# Patient Record
Sex: Female | Born: 1990 | Race: Black or African American | Hispanic: No | Marital: Single | Smoking: Never smoker
Health system: Southern US, Community
[De-identification: ages and names within clinical notes are randomized; demographics above are authoritative.]

## PROBLEM LIST (undated history)

## (undated) DIAGNOSIS — D649 Anemia, unspecified: Secondary | ICD-10-CM

## (undated) HISTORY — DX: Anemia, unspecified: D64.9

## (undated) HISTORY — PX: NO PAST SURGERIES: SHX2092

---

## 2019-08-20 ENCOUNTER — Other Ambulatory Visit: Payer: Self-pay

## 2019-08-20 ENCOUNTER — Encounter (HOSPITAL_COMMUNITY): Payer: Self-pay | Admitting: Emergency Medicine

## 2019-08-20 ENCOUNTER — Emergency Department (HOSPITAL_COMMUNITY)
Admission: EM | Admit: 2019-08-20 | Discharge: 2019-08-20 | Disposition: A | Discharge status: Home / Self Care | Payer: Medicaid Other | Attending: Emergency Medicine | Admitting: Emergency Medicine

## 2019-08-20 DIAGNOSIS — R21 Rash and other nonspecific skin eruption: Secondary | ICD-10-CM | POA: Diagnosis present

## 2019-08-20 DIAGNOSIS — T63461A Toxic effect of venom of wasps, accidental (unintentional), initial encounter: Secondary | ICD-10-CM | POA: Diagnosis not present

## 2019-08-20 DIAGNOSIS — M6282 Rhabdomyolysis: Secondary | ICD-10-CM | POA: Insufficient documentation

## 2019-08-20 DIAGNOSIS — T63481A Toxic effect of venom of other arthropod, accidental (unintentional), initial encounter: Secondary | ICD-10-CM

## 2019-08-20 LAB — CBC WITH DIFFERENTIAL/PLATELET
Abs Immature Granulocytes: 0.03 10*3/uL (ref 0.00–0.07)
Basophils Absolute: 0 10*3/uL (ref 0.0–0.1)
Basophils Relative: 0 %
Eosinophils Absolute: 0 10*3/uL (ref 0.0–0.5)
Eosinophils Relative: 0 %
HCT: 39.4 % (ref 36.0–46.0)
Hemoglobin: 12.9 g/dL (ref 12.0–15.0)
Immature Granulocytes: 0 %
Lymphocytes Relative: 12 %
Lymphs Abs: 1.2 10*3/uL (ref 0.7–4.0)
MCH: 29.1 pg (ref 26.0–34.0)
MCHC: 32.7 g/dL (ref 30.0–36.0)
MCV: 88.9 fL (ref 80.0–100.0)
Monocytes Absolute: 0.5 10*3/uL (ref 0.1–1.0)
Monocytes Relative: 5 %
Neutro Abs: 8.2 10*3/uL — ABNORMAL HIGH (ref 1.7–7.7)
Neutrophils Relative %: 83 %
Platelets: 502 10*3/uL — ABNORMAL HIGH (ref 150–400)
RBC: 4.43 MIL/uL (ref 3.87–5.11)
RDW: 13.2 % (ref 11.5–15.5)
WBC: 10 10*3/uL (ref 4.0–10.5)
nRBC: 0 % (ref 0.0–0.2)

## 2019-08-20 LAB — COMPREHENSIVE METABOLIC PANEL
ALT: 15 U/L (ref 0–44)
AST: 26 U/L (ref 15–41)
Albumin: 4.2 g/dL (ref 3.5–5.0)
Alkaline Phosphatase: 51 U/L (ref 38–126)
Anion gap: 11 (ref 5–15)
BUN: 10 mg/dL (ref 6–20)
CO2: 26 mmol/L (ref 22–32)
Calcium: 9.2 mg/dL (ref 8.9–10.3)
Chloride: 99 mmol/L (ref 98–111)
Creatinine, Ser: 0.76 mg/dL (ref 0.44–1.00)
GFR calc Af Amer: >60 mL/min (ref >60)
GFR calc non Af Amer: >60 mL/min (ref >60)
Glucose, Bld: 100 mg/dL — ABNORMAL HIGH (ref 70–99)
Potassium: 4.2 mmol/L (ref 3.5–5.1)
Sodium: 136 mmol/L (ref 135–145)
Total Bilirubin: 0.5 mg/dL (ref 0.3–1.2)
Total Protein: 8.4 g/dL — ABNORMAL HIGH (ref 6.5–8.1)

## 2019-08-20 LAB — URINALYSIS, ROUTINE W REFLEX MICROSCOPIC
Bacteria, UA: NONE SEEN
Bilirubin Urine: NEGATIVE
Glucose, UA: NEGATIVE mg/dL
Ketones, ur: 5 mg/dL — AB
Leukocytes,Ua: NEGATIVE
Nitrite: NEGATIVE
Protein, ur: NEGATIVE mg/dL
Specific Gravity, Urine: 1.029 (ref 1.005–1.030)
pH: 6 (ref 5.0–8.0)

## 2019-08-20 LAB — CK: Total CK: 763 U/L — ABNORMAL HIGH (ref 38–234)

## 2019-08-20 LAB — I-STAT BETA HCG BLOOD, ED (MC, WL, AP ONLY): I-stat hCG, quantitative: <5 m[IU]/mL (ref <5)

## 2019-08-20 LAB — LIPASE, BLOOD: Lipase: 19 U/L (ref 11–51)

## 2019-08-20 MED ORDER — DIPHENHYDRAMINE HCL 50 MG/ML IJ SOLN
25.0000 mg | Freq: Once | INTRAMUSCULAR | Status: AC
  Administered 2019-08-20: 25 mg via INTRAVENOUS
  Filled 2019-08-20: qty 1

## 2019-08-20 MED ORDER — SODIUM CHLORIDE 0.9 % IV BOLUS
1000.0000 mL | Freq: Once | INTRAVENOUS | Status: AC
  Administered 2019-08-20: 1000 mL via INTRAVENOUS

## 2019-08-20 MED ORDER — EPINEPHRINE 0.3 MG/0.3ML IJ SOAJ: 0.3000 mg | INTRAMUSCULAR | 0 refills | Status: AC | PRN

## 2019-08-20 NOTE — ED Provider Notes (Signed)
Joann Jordan   CSN: [id number] Arrival date & time: 08/20/19  0249     History Chief Complaint  Patient presents with  . Rash    Joann Jordan is a 29 y.o. female.  Joann Jordan is a 29 y.o. female who is otherwise healthy, presents to the ED for evaluation of rash.  She reports today around noon she was at work when she got stung on the back of her neck by a yellow jacket that had gotten into the building.  She states that aside from some mild pain surrounding the site of the sting she felt fine, she did take a Benadryl at work, does not have any known allergy to bees, wasps or yellow jackets.  She states when she got home from work around 5 she noticed a rash that consisted of small red bumps to her arms, legs and she also noted some on her back.  The rash was somewhat itchy, not painful.  She did state that it burns some when she tried to take a shower.  Around 9 PM she started to feel generally ill and malaise with body aches, nausea, some upper abdominal pain and back pain.  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  She denies any fevers.  No known sick contacts.  No chest pain or shortness of breath.  No cough.  No abnormal bowel movements.  No dysuria or urinary frequency.  No other aggravating or alleviating factors.        History reviewed. No pertinent past medical history.  There are no problems to display for this patient.   History reviewed. No pertinent surgical history.   OB History   No obstetric history on file.     History reviewed. No pertinent family history.  Social History   Tobacco Use  . Smoking status: Never Smoker  . Smokeless tobacco: Never Used  Vaping Use  . Vaping Use: Never used  Substance Use Topics  . Alcohol use: Never  . Drug use: Never    Home  Medications Prior to Admission medications   Medication Sig Start Date End Date Taking? Authorizing Provider  EPINEPHrine (EPIPEN 2-PAK) 0.3 mg/0.3 mL IJ SOAJ injection Inject 0.3 mLs (0.3 mg total) into the muscle as needed for anaphylaxis. 08/20/19   Dartha Lodge, PA-C    Allergies    Patient has no known allergies.  Review of Systems   Review of Systems  Constitutional: Positive for fatigue. Negative for chills and fever.  HENT: Negative for facial swelling, sore throat and trouble swallowing.   Eyes: Negative for visual disturbance.  Respiratory: Negative for cough and shortness of breath.   Cardiovascular: Negative for chest pain.  Gastrointestinal: Positive for abdominal pain and nausea. Negative for constipation, diarrhea and vomiting.  Genitourinary: Negative for dysuria, frequency, vaginal bleeding and vaginal discharge.  Musculoskeletal: Positive for back pain and myalgias. Negative for arthralgias.  Skin: Positive for rash. Negative for color change.  Neurological: Negative for dizziness, weakness, numbness and headaches.  All other systems reviewed and are negative.   Physical Exam Updated Vital Signs BP 135/65 (BP Location: Right Arm)   Pulse 97   Temp 99.1 F (37.3 C) (Oral)   Resp 15   Ht 5\' 2"  (1.575 m)   Wt 81.6 kg   SpO2 100%  BMI 32.92 kg/m   Physical Exam Vitals and nursing Jordan reviewed.  Constitutional:      General: She is not in acute distress.    Appearance: Normal appearance. She is well-developed. She is not ill-appearing or diaphoretic.  HENT:     Head: Normocephalic and atraumatic.     Mouth/Throat:     Mouth: Mucous membranes are moist.     Pharynx: Oropharynx is clear.     Comments: No Intraoral rash or lesions Eyes:     General:        Right eye: No discharge.        Left eye: No discharge.     Pupils: Pupils are equal, round, and reactive to light.  Neck:     Comments: 2 cm erythematous welts noted on right posterior neck where  patient reports that she was stung by a yellow jacket Cardiovascular:     Rate and Rhythm: Normal rate and regular rhythm.     Heart sounds: Normal heart sounds. No murmur heard.  No friction rub. No gallop.   Pulmonary:     Effort: Pulmonary effort is normal. No respiratory distress.     Breath sounds: Normal breath sounds. No wheezing or rales.     Comments: Respirations equal and unlabored, patient able to speak in full sentences, lungs clear to auscultation bilaterally Abdominal:     General: Bowel sounds are normal. There is no distension.     Palpations: Abdomen is soft. There is no mass.     Tenderness: There is abdominal tenderness. There is no guarding.     Comments: Abdomen is soft, nondistended, bowel sounds present throughout, there is some mild epigastric tenderness, all other quadrants nontender, no CVA tenderness  Musculoskeletal:        General: No deformity.     Cervical back: Neck supple.     Comments: T-spine and L-spine nontender to palpation at midline. Mild tenderness across low back musclualture Patient moves all extremities without difficulty. All joints supple and easily movable, no erythema, swelling or palpable deformity, all compartments soft.   Skin:    General: Skin is warm and dry.     Capillary Refill: Capillary refill takes less than 2 seconds.     Findings: Rash present.     Comments: Scattered small raised erythematous bumps already upper and lower extremities as well as a few noted on the back.  These do blanch I did not see any vesicles, pustules or petechiae, no skin sloughing.  Neurological:     Mental Status: She is alert.     Coordination: Coordination normal.     Comments: Speech is clear, able to follow commands Moves extremities without ataxia, coordination intact  Psychiatric:        Mood and Affect: Mood normal.        Behavior: Behavior normal.     ED Results / Procedures / Treatments   Labs (all labs ordered are listed, but only  abnormal results are displayed) Labs Reviewed  URINALYSIS, ROUTINE W REFLEX MICROSCOPIC - Abnormal; Notable for the following components:      Result Value   Hgb urine dipstick MODERATE (*)    Ketones, ur 5 (*)    All other components within normal limits  COMPREHENSIVE METABOLIC PANEL - Abnormal; Notable for the following components:   Glucose, Bld 100 (*)    Total Protein 8.4 (*)    All other components within normal limits  CBC WITH DIFFERENTIAL/PLATELET - Abnormal; Notable for  the following components:   Platelets 502 (*)    Neutro Abs 8.2 (*)    All other components within normal limits  CK - Abnormal; Notable for the following components:   Total CK 763 (*)    All other components within normal limits  LIPASE, BLOOD  I-STAT BETA HCG BLOOD, ED ([facility], [facility], [facility] ONLY)    EKG None  Radiology No results found.  Procedures Procedures (including critical care time)  Medications Ordered in ED Medications  diphenhydrAMINE (BENADRYL) injection 25 mg (25 mg Intravenous Given 08/20/19 0607)  sodium chloride 0.9 % bolus 1,000 mL (1,000 mLs Intravenous New Bag/Given 08/20/19 5035)    ED Course  I have reviewed the triage vital signs and the nursing notes.  Pertinent labs & imaging results that were available during my care of the patient were reviewed by me and considered in my medical decision making (see chart for details).    MDM Rules/Calculators/A&P                         29 year old female presents with rash and generalized fatigue, body aches and some upper abdominal pain after she was stung by a yellow jacket earlier this afternoon.  Did not have immediate allergic reaction, no symptoms suggestive of anaphylaxis, but when she got home she noted a rash on her extremities and later in the evening he began to feel malaise and fatigue with body aches, some nausea and upper abdominal pain.  On arrival patient has normal vitals normal is well-appearing and in no distress.  There is a  welt on her neck where she states she was stung by yellow jacket, and she has a rabies erythematous rash over the lower extremities that does not appear consistent with urticaria.  Given her general malaise and fatigue with no other signs suggestive of a more severe allergic reaction will check basic labs and urinalysis.  Will give IV fluids and Benadryl.  I have personally ordered, reviewed and interpreted lab work: CBC: No leukocytosis, normal hemoglobin, platelets of 502 CMP: No significant electrolyte derangements, normal renal and liver function Lipase WNL UA: Moderate hemoglobin noted but with no red cells, I am more concerned this may be myoglobin given fatigue malaise and myalgias.  Patient with no signs of UTI, will check CK  Patient CK is mildly elevated at 763, this is not high enough to warrant admission but is likely early or mild rhabdomyolysis.  Patient given IV fluids.  This could be related to insect sting or from strenuous activities at that point.  After fluid she is feeling improved.  I have counseled her on resting over the next few days and avoiding strenuous activity, drinking plenty of fluids and I discussed strict return precautions with the patient.  Benadryl for rash.  Patient expresses understanding and agreement.  Discharged home in good condition. Final Clinical Impression(s) / ED Diagnoses Final diagnoses:  Non-traumatic rhabdomyolysis  Rash  Insect stings, accidental or unintentional, initial encounter    Rx / DC Orders ED Discharge Orders         Ordered    EPINEPHrine (EPIPEN 2-PAK) 0.3 mg/0.3 mL IJ SOAJ injection  As needed     Discontinue  Reprint     08/20/19 0656           Dartha Lodge, PA-C 08/20/19 2356    Nira Conn, MD 08/21/19 [phone]

## 2019-08-20 NOTE — ED Triage Notes (Signed)
Patient states that she got stung by a yellow jacket yesterday. Patient states that she did not have any difficulty breathing or any swelling. Patient states when she got home from work she saw red bumps on both legs. Patient also has the same rash on her lower back. Patient states that she started having abdominal pain around 12 am. Patient is having some white discharge. No burning when urinating.

## 2019-08-20 NOTE — Discharge Instructions (Signed)
I suspect the you have been feeling poorly with body and muscle aches because of muscle breakdown, called rhabdomyolysis, this appears to be mild and is not affecting her kidney function.  It is important that you avoid any strenuous activity and make sure you are drinking plenty of fluids.  If you start to feel worse with worsening body aches, fatigue, or if you notice very dark urine you should return to the emergency department to have your CK levels and kidney function rechecked.  Sometimes this can occur because of an insect sting, and with your rash I suspect you have had some mild allergic reaction, if you were to get stung again you could have a more severe allergic reaction so you should carry an EpiPen with you, this has been prescribed today.

## 2019-08-22 ENCOUNTER — Encounter (HOSPITAL_COMMUNITY): Payer: Self-pay

## 2019-08-22 ENCOUNTER — Emergency Department (HOSPITAL_COMMUNITY)
Admission: EM | Admit: 2019-08-22 | Discharge: 2019-08-22 | Disposition: A | Discharge status: Home / Self Care | Payer: Medicaid Other | Attending: Emergency Medicine | Admitting: Emergency Medicine

## 2019-08-22 ENCOUNTER — Other Ambulatory Visit: Payer: Self-pay

## 2019-08-22 DIAGNOSIS — R109 Unspecified abdominal pain: Secondary | ICD-10-CM | POA: Insufficient documentation

## 2019-08-22 DIAGNOSIS — Z5321 Procedure and treatment not carried out due to patient leaving prior to being seen by health care provider: Secondary | ICD-10-CM | POA: Insufficient documentation

## 2019-08-22 NOTE — ED Triage Notes (Signed)
She states she was seen here a couple of days ago for diffuse upper abd. Discomfort and generalized small urticaria. She states her symptoms persist. She is breathing normally.

## 2019-11-02 LAB — PREGNANCY, URINE: Preg Test, Ur: POSITIVE

## 2019-11-23 ENCOUNTER — Other Ambulatory Visit (HOSPITAL_COMMUNITY)
Admission: RE | Admit: 2019-11-23 | Discharge: 2019-11-23 | Disposition: A | Discharge status: Home / Self Care | Payer: Medicaid Other | Source: Ambulatory Visit | Attending: Obstetrics & Gynecology | Admitting: Obstetrics & Gynecology

## 2019-11-23 ENCOUNTER — Other Ambulatory Visit: Payer: Self-pay

## 2019-11-23 ENCOUNTER — Ambulatory Visit (INDEPENDENT_AMBULATORY_CARE_PROVIDER_SITE_OTHER): Payer: Medicaid Other | Admitting: Obstetrics & Gynecology

## 2019-11-23 ENCOUNTER — Encounter: Payer: Self-pay | Admitting: Obstetrics & Gynecology

## 2019-11-23 ENCOUNTER — Encounter: Payer: Self-pay | Admitting: *Deleted

## 2019-11-23 VITALS — BP 114/68 | HR 82 | Wt 175.9 lb

## 2019-11-23 DIAGNOSIS — K219 Gastro-esophageal reflux disease without esophagitis: Secondary | ICD-10-CM

## 2019-11-23 DIAGNOSIS — Z8632 Personal history of gestational diabetes: Secondary | ICD-10-CM

## 2019-11-23 DIAGNOSIS — O0990 Supervision of high risk pregnancy, unspecified, unspecified trimester: Secondary | ICD-10-CM

## 2019-11-23 DIAGNOSIS — O99619 Diseases of the digestive system complicating pregnancy, unspecified trimester: Secondary | ICD-10-CM

## 2019-11-23 LAB — OB RESULTS CONSOLE GBS: GBS: POSITIVE

## 2019-11-23 MED ORDER — BLOOD PRESSURE KIT DEVI: 1.0000 | Freq: Every day | 0 refills | Status: AC

## 2019-11-23 MED ORDER — PANTOPRAZOLE SODIUM 20 MG PO TBEC: 20.0000 mg | DELAYED_RELEASE_TABLET | Freq: Every day | ORAL | 5 refills | Status: DC

## 2019-11-23 NOTE — Progress Notes (Signed)
Pap was done at [facility] in [place] in 2020 will need to sign release of information for this.  Diet controlled GDM in last Pregnancy   Considering BTL   Sent baby scripts information

## 2019-11-23 NOTE — Patient Instructions (Signed)

## 2019-11-23 NOTE — Progress Notes (Signed)
  Subjective:Delivered at [facility] last pregnancy and had GDM    Joann Jordan is a [id number] [redacted]w[redacted]d being seen today for her first obstetrical visit.  Her obstetrical history is significant for h/o GDM. Patient does intend to breast feed. Pregnancy history fully reviewed.  Patient reports heartburn and vomiting.  Vitals:   11/23/19 1050  BP: 114/68  Pulse: 82  Weight: 175 lb 14.4 oz (79.8 kg)    HISTORY: OB History  Gravida Para Term Preterm AB Living  3 2 2     2   SAB TAB Ectopic Multiple Live Births          2    # Outcome Date GA Lbr Len/2nd Weight Sex Delivery Anes PTL Lv  3 Current           2 Term 01/21/18    M  EPI  LIV  1 Term 11/04/14    M Vag-Spont EPI  LIV   History reviewed. No pertinent past medical history. Past Surgical History:  Procedure Laterality Date  . NO PAST SURGERIES     Family History  Problem Relation Age of Onset  . Diabetes Mother   . Diabetes Father      Exam    Uterus:     Pelvic Exam:    Perineum: No Hemorrhoids   Vulva: normal   Vagina:  normal mucosa   pH:     Cervix: closed   Adnexa: normal adnexa   Bony Pelvis: average  System: Breast:  normal appearance, no masses or tenderness   Skin: normal coloration and turgor, no rashes    Neurologic: oriented, normal mood   Extremities: normal strength, tone, and muscle mass   HEENT PERRLA, neck supple with midline trachea and thyroid without masses   Mouth/Teeth mucous membranes moist, pharynx normal without lesions   Neck supple and no masses   Cardiovascular: regular rate and rhythm, no murmurs or gallops   Respiratory:  appears well, vitals normal, no respiratory distress, acyanotic, normal RR, neck free of mass or lymphadenopathy, chest clear, no wheezing, crepitations, rhonchi, normal symmetric air entry   Abdomen: soft, non-tender; bowel sounds normal; no masses,  no organomegaly   Urinary: urethral meatus normal      Assessment:    Pregnancy: 11/06/14 Patient Active Problem  List   Diagnosis Date Noted  . Supervision of high risk pregnancy, antepartum 11/23/2019        Plan:     Initial labs drawn. Prenatal vitamins. Problem list reviewed and updated. Genetic Screening discussed  ordered.  Ultrasound discussed; fetal survey: ordered.  Follow up in 4 weeks. 50% of 30 min visit spent on counseling and coordination of care.  Protonix for reflux   11/25/2019 11/23/2019

## 2019-11-24 LAB — CBC/D/PLT+RPR+RH+ABO+RUB AB...
Antibody Screen: NEGATIVE
Basophils Absolute: 0 10*3/uL (ref 0.0–0.2)
Basos: 0 %
EOS (ABSOLUTE): 0.1 10*3/uL (ref 0.0–0.4)
Eos: 1 %
HCV Ab: 0.2 s/co ratio (ref 0.0–0.9)
HIV Screen 4th Generation wRfx: NONREACTIVE
Hematocrit: 34.6 % (ref 34.0–46.6)
Hemoglobin: 11.6 g/dL (ref 11.1–15.9)
Hepatitis B Surface Ag: NEGATIVE
Immature Grans (Abs): 0 10*3/uL (ref 0.0–0.1)
Immature Granulocytes: 0 %
Lymphocytes Absolute: 1.7 10*3/uL (ref 0.7–3.1)
Lymphs: 25 %
MCH: 29.5 pg (ref 26.6–33.0)
MCHC: 33.5 g/dL (ref 31.5–35.7)
MCV: 88 fL (ref 79–97)
Monocytes Absolute: 0.4 10*3/uL (ref 0.1–0.9)
Monocytes: 6 %
Neutrophils Absolute: 4.5 10*3/uL (ref 1.4–7.0)
Neutrophils: 68 %
Platelets: 343 10*3/uL (ref 150–450)
RBC: 3.93 x10E6/uL (ref 3.77–5.28)
RDW: 13.2 % (ref 11.7–15.4)
RPR Ser Ql: NONREACTIVE
Rh Factor: POSITIVE
Rubella Antibodies, IGG: 3.73 index (ref >0.99)
WBC: 6.8 10*3/uL (ref 3.4–10.8)

## 2019-11-24 LAB — CERVICOVAGINAL ANCILLARY ONLY
Bacterial Vaginitis (gardnerella): POSITIVE — AB
Candida Glabrata: NEGATIVE
Candida Vaginitis: NEGATIVE
Chlamydia: POSITIVE — AB
Comment: NEGATIVE
Comment: NEGATIVE
Comment: NEGATIVE
Comment: NEGATIVE
Comment: NEGATIVE
Comment: NORMAL
Neisseria Gonorrhea: NEGATIVE
Trichomonas: NEGATIVE

## 2019-11-24 LAB — HCV INTERPRETATION

## 2019-11-25 ENCOUNTER — Encounter: Payer: Self-pay | Admitting: *Deleted

## 2019-11-25 ENCOUNTER — Other Ambulatory Visit (HOSPITAL_COMMUNITY): Payer: Self-pay | Admitting: Obstetrics & Gynecology

## 2019-11-25 ENCOUNTER — Other Ambulatory Visit: Payer: Self-pay | Admitting: *Deleted

## 2019-11-25 DIAGNOSIS — O98819 Other maternal infectious and parasitic diseases complicating pregnancy, unspecified trimester: Secondary | ICD-10-CM

## 2019-11-25 DIAGNOSIS — B9689 Other specified bacterial agents as the cause of diseases classified elsewhere: Secondary | ICD-10-CM

## 2019-11-25 DIAGNOSIS — N760 Acute vaginitis: Secondary | ICD-10-CM

## 2019-11-25 DIAGNOSIS — A749 Chlamydial infection, unspecified: Secondary | ICD-10-CM

## 2019-11-25 DIAGNOSIS — O219 Vomiting of pregnancy, unspecified: Secondary | ICD-10-CM

## 2019-11-25 MED ORDER — PROMETHAZINE HCL 25 MG PO TABS
25.0000 mg | ORAL_TABLET | Freq: Four times a day (QID) | ORAL | 0 refills | Status: DC | PRN
Start: 2019-11-25 — End: 2020-02-18

## 2019-11-25 MED ORDER — METRONIDAZOLE 500 MG PO TABS: 500.0000 mg | ORAL_TABLET | Freq: Two times a day (BID) | ORAL | 0 refills | Status: DC

## 2019-11-25 MED ORDER — AZITHROMYCIN 250 MG PO TABS
ORAL_TABLET | ORAL | 0 refills | Status: DC
Start: 2019-11-25 — End: 2019-12-22

## 2019-11-25 MED ORDER — METRONIDAZOLE 500 MG PO TABS: 500.0000 mg | ORAL_TABLET | Freq: Two times a day (BID) | ORAL | 0 refills | Status: AC

## 2019-11-25 NOTE — Addendum Note (Signed)
Addended by: Jill Side on: 11/25/2019 03:04 PM   Modules accepted: Orders

## 2019-11-26 ENCOUNTER — Encounter: Payer: Self-pay | Admitting: General Practice

## 2019-11-27 LAB — URINE CULTURE, OB REFLEX

## 2019-11-27 LAB — CULTURE, OB URINE

## 2019-11-30 ENCOUNTER — Other Ambulatory Visit (HOSPITAL_COMMUNITY): Payer: Self-pay | Admitting: Obstetrics & Gynecology

## 2019-11-30 DIAGNOSIS — O99820 Streptococcus B carrier state complicating pregnancy: Secondary | ICD-10-CM

## 2019-11-30 DIAGNOSIS — O0990 Supervision of high risk pregnancy, unspecified, unspecified trimester: Secondary | ICD-10-CM

## 2019-11-30 MED ORDER — AMPICILLIN 500 MG PO CAPS: 500.0000 mg | ORAL_CAPSULE | Freq: Four times a day (QID) | ORAL | 0 refills | Status: DC

## 2019-12-01 ENCOUNTER — Encounter: Payer: Self-pay | Admitting: General Practice

## 2019-12-14 ENCOUNTER — Ambulatory Visit: Payer: Medicaid Other | Attending: Obstetrics & Gynecology

## 2019-12-14 ENCOUNTER — Other Ambulatory Visit: Payer: Self-pay | Admitting: *Deleted

## 2019-12-14 ENCOUNTER — Ambulatory Visit: Payer: Medicaid Other | Admitting: *Deleted

## 2019-12-14 ENCOUNTER — Encounter: Payer: Self-pay | Admitting: *Deleted

## 2019-12-14 ENCOUNTER — Other Ambulatory Visit: Payer: Self-pay

## 2019-12-14 DIAGNOSIS — O0990 Supervision of high risk pregnancy, unspecified, unspecified trimester: Secondary | ICD-10-CM

## 2019-12-14 DIAGNOSIS — Z6832 Body mass index (BMI) 32.0-32.9, adult: Secondary | ICD-10-CM

## 2019-12-14 DIAGNOSIS — Z8632 Personal history of gestational diabetes: Secondary | ICD-10-CM | POA: Insufficient documentation

## 2019-12-22 ENCOUNTER — Other Ambulatory Visit: Payer: Self-pay

## 2019-12-22 ENCOUNTER — Ambulatory Visit (INDEPENDENT_AMBULATORY_CARE_PROVIDER_SITE_OTHER): Payer: Medicaid Other | Admitting: Obstetrics & Gynecology

## 2019-12-22 VITALS — BP 118/75 | HR 89 | Wt 179.0 lb

## 2019-12-22 DIAGNOSIS — O99619 Diseases of the digestive system complicating pregnancy, unspecified trimester: Secondary | ICD-10-CM

## 2019-12-22 DIAGNOSIS — O0990 Supervision of high risk pregnancy, unspecified, unspecified trimester: Secondary | ICD-10-CM

## 2019-12-22 DIAGNOSIS — K219 Gastro-esophageal reflux disease without esophagitis: Secondary | ICD-10-CM

## 2019-12-22 MED ORDER — ONDANSETRON 4 MG PO TBDP: 4.0000 mg | ORAL_TABLET | Freq: Four times a day (QID) | ORAL | 0 refills | Status: DC | PRN

## 2019-12-22 MED ORDER — PANTOPRAZOLE SODIUM 40 MG PO TBEC: 40.0000 mg | DELAYED_RELEASE_TABLET | Freq: Every day | ORAL | 5 refills | Status: AC

## 2019-12-22 NOTE — Progress Notes (Signed)
Home Medicaid Form completed  

## 2019-12-22 NOTE — Patient Instructions (Signed)

## 2019-12-22 NOTE — Progress Notes (Signed)
° °  PRENATAL VISIT NOTE  Subjective:  Joann Jordan is a 29 y.o. G3P2002 at [redacted]w[redacted]d being seen today for ongoing prenatal care.  She is currently monitored for the following issues for this high-risk pregnancy and has Supervision of high risk pregnancy, antepartum and Group B streptococcal carriage complicating pregnancy on their problem list.  Patient reports heartburn, nausea and vomiting.  Contractions: Not present. Vag. Bleeding: None.  Movement: Present. Denies leaking of fluid.   The following portions of the patient's history were reviewed and updated as appropriate: allergies, current medications, past family history, past medical history, past social history, past surgical history and problem list.   Objective:   Vitals:   12/22/19 0921  BP: 118/75  Pulse: 89  Weight: 179 lb (81.2 kg)    Fetal Status: Fetal Heart Rate (bpm): 149   Movement: Present     General:  Alert, oriented and cooperative. Patient is in no acute distress.  Skin: Skin is warm and dry. No rash noted.   Cardiovascular: Normal heart rate noted  Respiratory: Normal respiratory effort, no problems with respiration noted  Abdomen: Soft, gravid, appropriate for gestational age.  Pain/Pressure: Absent     Pelvic: Cervical exam deferred        Extremities: Normal range of motion.  Edema: Trace  Mental Status: Normal mood and affect. Normal behavior. Normal judgment and thought content.   Assessment and Plan:  Pregnancy: G3P2002 at [redacted]w[redacted]d 1. Supervision of high risk pregnancy, antepartum Screening for ONTD - AFP, Serum, Open Spina Bifida  2. Gastroesophageal reflux in pregnancy Control is not adequate - pantoprazole (PROTONIX) 40 MG tablet; Take 1 tablet (40 mg total) by mouth daily.  Dispense: 30 tablet; Refill: 5 - ondansetron (ZOFRAN ODT) 4 MG disintegrating tablet; Take 1 tablet (4 mg total) by mouth every 6 (six) hours as needed for nausea.  Dispense: 20 tablet; Refill: 0  Preterm labor symptoms and  general obstetric precautions including but not limited to vaginal bleeding, contractions, leaking of fluid and fetal movement were reviewed in detail with the patient. Please refer to After Visit Summary for other counseling recommendations.   Return in about 4 weeks (around 01/19/2020).  Future Appointments  Date Time Provider Department Center  01/11/2020  7:30 AM [facility] NURSE [facility]  01/11/2020  7:45 AM [facility]    Scheryl Darter, MD

## 2019-12-23 ENCOUNTER — Encounter: Payer: Self-pay | Admitting: *Deleted

## 2019-12-24 LAB — AFP, SERUM, OPEN SPINA BIFIDA
AFP MoM: 0.89
AFP Value: 43.3 ng/mL
Gest. Age on Collection Date: 18.9 weeks
Maternal Age At EDD: 28.6 yr
OSBR Risk 1 IN: 10000
Test Results:: NEGATIVE
Weight: 179 [lb_av]

## 2020-01-11 ENCOUNTER — Ambulatory Visit: Payer: Medicaid Other

## 2020-01-16 ENCOUNTER — Other Ambulatory Visit: Payer: Self-pay

## 2020-01-16 DIAGNOSIS — K219 Gastro-esophageal reflux disease without esophagitis: Secondary | ICD-10-CM

## 2020-01-18 ENCOUNTER — Other Ambulatory Visit: Payer: Self-pay | Admitting: *Deleted

## 2020-01-18 ENCOUNTER — Encounter: Payer: Self-pay | Admitting: *Deleted

## 2020-01-18 DIAGNOSIS — O99619 Diseases of the digestive system complicating pregnancy, unspecified trimester: Secondary | ICD-10-CM

## 2020-01-18 MED ORDER — ONDANSETRON 4 MG PO TBDP: 4.0000 mg | ORAL_TABLET | Freq: Four times a day (QID) | ORAL | 0 refills | Status: DC | PRN

## 2020-01-22 ENCOUNTER — Encounter: Payer: Medicaid Other | Admitting: Certified Nurse Midwife

## 2020-01-26 ENCOUNTER — Ambulatory Visit: Payer: Medicaid Other | Attending: Obstetrics & Gynecology | Admitting: *Deleted

## 2020-01-26 ENCOUNTER — Other Ambulatory Visit: Payer: Self-pay

## 2020-01-26 ENCOUNTER — Ambulatory Visit (HOSPITAL_BASED_OUTPATIENT_CLINIC_OR_DEPARTMENT_OTHER): Payer: Medicaid Other

## 2020-01-26 ENCOUNTER — Encounter: Payer: Self-pay | Admitting: *Deleted

## 2020-01-26 DIAGNOSIS — Z6832 Body mass index (BMI) 32.0-32.9, adult: Secondary | ICD-10-CM

## 2020-01-26 DIAGNOSIS — Z3A25 25 weeks gestation of pregnancy: Secondary | ICD-10-CM | POA: Diagnosis not present

## 2020-01-26 DIAGNOSIS — Z362 Encounter for other antenatal screening follow-up: Secondary | ICD-10-CM

## 2020-01-26 DIAGNOSIS — Z3A24 24 weeks gestation of pregnancy: Secondary | ICD-10-CM | POA: Diagnosis not present

## 2020-01-26 DIAGNOSIS — O321XX0 Maternal care for breech presentation, not applicable or unspecified: Secondary | ICD-10-CM | POA: Diagnosis not present

## 2020-01-26 DIAGNOSIS — O358XX0 Maternal care for other (suspected) fetal abnormality and damage, not applicable or unspecified: Secondary | ICD-10-CM | POA: Insufficient documentation

## 2020-01-26 DIAGNOSIS — O0990 Supervision of high risk pregnancy, unspecified, unspecified trimester: Secondary | ICD-10-CM

## 2020-02-01 ENCOUNTER — Ambulatory Visit: Payer: Medicaid Other

## 2020-02-03 ENCOUNTER — Encounter: Payer: Medicaid Other | Admitting: Family Medicine

## 2020-02-06 NOTE — L&D Delivery Note (Signed)
OB/GYN Faculty Practice Delivery Note  Rasheida Broden is a 30 y.o. [id number] s/p SVD at [redacted]w[redacted]d. She was admitted for elective IOL.   ROM: 1h 73m with clear fluid GBS Status:  Positive/-- (10/18 0000) Maximum Maternal Temperature: 99.1  Labor Progress: . Initial SVE: Patient received cytotec, foley balloon. She was started on pitocin and was AROM'd. She then progressed to complete.   Delivery Date/Time: 0630 on 4/14 Delivery: Called to room and patient was complete. Delivered baby with one push. Head delivered LOA. Loose nuchal cord present. Shoulder and body delivered in usual fashion. Infant with spontaneous cry, placed on mother's abdomen, dried and stimulated. Cord clamped x 2 after 1-minute delay, and cut by patient. Cord blood drawn. Placenta delivered spontaneously with gentle cord traction. Fundus firm with massage and Pitocin. Labia, perineum, vagina, and cervix inspected inspected with no lacerations.  Baby Weight: pending  Placenta: Sent to L&D  Complications: None Lacerations: none EBL: 10 mL Analgesia: Epidural    Infant:  APGAR (1 MIN):  9 APGAR (5 MINS): 9  APGAR (10 MINS):     Casper Harrison, MD [facility] Fellow, [facility] for [facility], [facility] 05/19/2020, 1:45 AM

## 2020-02-18 ENCOUNTER — Ambulatory Visit (INDEPENDENT_AMBULATORY_CARE_PROVIDER_SITE_OTHER): Payer: Medicaid Other | Admitting: Obstetrics and Gynecology

## 2020-02-18 ENCOUNTER — Other Ambulatory Visit: Payer: Self-pay

## 2020-02-18 VITALS — BP 117/68 | HR 91 | Wt 186.0 lb

## 2020-02-18 DIAGNOSIS — Z23 Encounter for immunization: Secondary | ICD-10-CM | POA: Diagnosis not present

## 2020-02-18 DIAGNOSIS — O0990 Supervision of high risk pregnancy, unspecified, unspecified trimester: Secondary | ICD-10-CM | POA: Diagnosis not present

## 2020-02-18 DIAGNOSIS — Z3A27 27 weeks gestation of pregnancy: Secondary | ICD-10-CM | POA: Insufficient documentation

## 2020-02-18 DIAGNOSIS — O99820 Streptococcus B carrier state complicating pregnancy: Secondary | ICD-10-CM

## 2020-02-18 NOTE — Patient Instructions (Signed)
Oral Glucose Tolerance Test During Pregnancy Why am I having this test? The oral glucose tolerance test (OGTT) is done to check how your body processes blood sugar (glucose). This is one of several tests used to diagnose diabetes that develops during pregnancy (gestational diabetes mellitus). Gestational diabetes is a short-term form of diabetes that some women develop while they are pregnant. It usually occurs during the second trimester of pregnancy and goes away after delivery. Testing, or screening, for gestational diabetes usually occurs at weeks 24-28 of pregnancy. You may have the OGTT test after having a 1-hour glucose screening test if the results from that test indicate that you may have gestational diabetes. This test may also be needed if:  You have a history of gestational diabetes.  There is a history of giving birth to very large babies or of losing pregnancies (having stillbirths).  You have signs and symptoms of diabetes, such as: ? Changes in your eyesight. ? Tingling or numbness in your hands or feet. ? Changes in hunger, thirst, and urination, and these are not explained by your pregnancy. What is being tested? This test measures the amount of glucose in your blood at different times during a period of 3 hours. This shows how well your body can process glucose. What kind of sample is taken? Blood samples are required for this test. They are usually collected by inserting a needle into a blood vessel.   How do I prepare for this test?  For 3 days before your test, eat normally. Have plenty of carbohydrate-rich foods.  Follow instructions from your health care provider about: ? Eating or drinking restrictions on the day of the test. You may be asked not to eat or drink anything other than water (to fast) starting 8-10 hours before the test. ? Changing or stopping your regular medicines. Some medicines may interfere with this test. Tell a health care provider about:  All  medicines you are taking, including vitamins, herbs, eye drops, creams, and over-the-counter medicines.  Any blood disorders you have.  Any surgeries you have had.  Any medical conditions you have. What happens during the test? First, your blood glucose will be measured. This is referred to as your fasting blood glucose because you fasted before the test. Then, you will drink a glucose solution that contains a certain amount of glucose. Your blood glucose will be measured again 1, 2, and 3 hours after you drink the solution. This test takes about 3 hours to complete. You will need to stay at the testing location during this time. During the testing period:  Do not eat or drink anything other than the glucose solution.  Do not exercise.  Do not use any products that contain nicotine or tobacco, such as cigarettes, e-cigarettes, and chewing tobacco. These can affect your test results. If you need help quitting, ask your health care provider. The testing procedure may vary among health care providers and hospitals. How are the results reported? Your results will be reported as milligrams of glucose per deciliter of blood (mg/dL) or millimoles per liter (mmol/L). There is more than one source for screening and diagnosis reference values used to diagnose gestational diabetes. Your health care provider will compare your results to normal values that were established after testing a large group of people (reference values). Reference values may vary among labs and hospitals. For this test (Carpenter-Coustan), reference values are:  Fasting: 95 mg/dL (5.3 mmol/L).  1 hour: 180 mg/dL (10.0 mmol/L).  2 hour:   155 mg/dL (8.6 mmol/L).  3 hour: 140 mg/dL (7.8 mmol/L). What do the results mean? Results below the reference values are considered normal. If two or more of your blood glucose levels are at or above the reference values, you may be diagnosed with gestational diabetes. If only one level is  high, your health care provider may suggest repeat testing or other tests to confirm a diagnosis. Talk with your health care provider about what your results mean. Questions to ask your health care provider Ask your health care provider, or the department that is doing the test:  When will my results be ready?  How will I get my results?  What are my treatment options?  What other tests do I need?  What are my next steps? Summary  The oral glucose tolerance test (OGTT) is one of several tests used to diagnose diabetes that develops during pregnancy (gestational diabetes mellitus). Gestational diabetes is a short-term form of diabetes that some women develop while they are pregnant.  You may have the OGTT test after having a 1-hour glucose screening test if the results from that test show that you may have gestational diabetes. You may also have this test if you have any symptoms or risk factors for this type of diabetes.  Talk with your health care provider about what your results mean. This information is not intended to replace advice given to you by your health care provider. Make sure you discuss any questions you have with your health care provider. Document Revised: 07/02/2019 Document Reviewed: 07/02/2019 Elsevier Patient Education  2021 [employer]. Third Trimester of Pregnancy  The third trimester of pregnancy is from week 28 through week 40. This is also called months 7 through 9. This trimester is when your unborn baby (fetus) is growing very fast. At the end of the ninth month, the unborn baby is about 20 inches long. It weighs about 6-10 pounds. Body changes during your third trimester Your body continues to go through many changes during this time. The changes vary and generally return to normal after the baby is born. Physical changes  Your weight will continue to increase. You may gain 25-35 pounds (11-16 kg) by the end of the pregnancy. If you are underweight, you  may gain 28-40 lb (about 13-18 kg). If you are overweight, you may gain 15-25 lb (about 7-11 kg).  You may start to get stretch marks on your hips, belly (abdomen), and breasts.  Your breasts will continue to grow and may hurt. A yellow fluid (colostrum) may leak from your breasts. This is the first milk you are making for your baby.  You may have changes in your hair.  Your belly button may stick out.  You may have more swelling in your hands, face, or ankles. Health changes  You may have heartburn.  You may have trouble pooping (constipation).  You may get hemorrhoids. These are swollen veins in the butt that can itch or get painful.  You may have swollen veins (varicose veins) in your legs.  You may have more body aches in the pelvis, back, or thighs.  You may have more tingling or numbness in your hands, arms, and legs. The skin on your belly may also feel numb.  You may feel short of breath as your womb (uterus) gets bigger. Other changes  You may pee (urinate) more often.  You may have more problems sleeping.  You may notice the unborn baby "dropping," or moving lower in your  belly.  You may have more discharge coming from your vagina.  Your joints may feel loose, and you may have pain around your pelvic bone. Follow these instructions at home: Medicines  Take over-the-counter and prescription medicines only as told by your doctor. Some medicines are not safe during pregnancy.  Take a prenatal vitamin that contains at least 600 micrograms (mcg) of folic acid. Eating and drinking  Eat healthy meals that include: ? Fresh fruits and vegetables. ? Whole grains. ? Good sources of protein, such as meat, eggs, or tofu. ? Low-fat dairy products.  Avoid raw meat and unpasteurized juice, milk, and cheese. These carry germs that can harm you and your baby.  Eat 4 or 5 small meals rather than 3 large meals a day.  You may need to take these actions to prevent or treat  trouble pooping: ? Drink enough fluids to keep your pee (urine) pale yellow. ? Eat foods that are high in fiber. These include beans, whole grains, and fresh fruits and vegetables. ? Limit foods that are high in fat and sugar. These include fried or sweet foods. Activity  Exercise only as told by your doctor. Stop exercising if you start to have cramps in your womb.  Avoid heavy lifting.  Do not exercise if it is too hot or too humid, or if you are in a place of great height (high altitude).  If you choose to, you may have sex unless your doctor tells you not to. Relieving pain and discomfort  Take breaks often, and rest with your legs raised (elevated) if you have leg cramps or low back pain.  Take warm water baths (sitz baths) to soothe pain or discomfort caused by hemorrhoids. Use hemorrhoid cream if your doctor approves.  Wear a good support bra if your breasts are tender.  If you develop bulging, swollen veins in your legs: ? Wear support hose as told by your doctor. ? Raise your feet for 15 minutes, 3-4 times a day. ? Limit salt in your food. Safety  Talk to your doctor before traveling far distances.  Do not use hot tubs, steam rooms, or saunas.  Wear your seat belt at all times when you are in a car.  Talk with your doctor if someone is hurting you or yelling at you a lot. Preparing for your baby's arrival To prepare for the arrival of your baby:  Take prenatal classes.  Visit the hospital and tour the maternity area.  Buy a rear-facing car seat. Learn how to install it in your car.  Prepare the baby's room. Take out all pillows and stuffed animals from the baby's crib. General instructions  Avoid cat litter boxes and soil used by cats. These carry germs that can cause harm to the baby and can cause a loss of your baby by miscarriage or stillbirth.  Do not douche or use tampons. Do not use scented sanitary pads.  Do not smoke or use any products that contain  nicotine or tobacco. If you need help quitting, ask your doctor.  Do not drink alcohol.  Do not use herbal medicines, illegal drugs, or medicines that were not approved by your doctor. Chemicals in these products can affect your baby.  Keep all follow-up visits. This is important. Where to find more information  American Pregnancy Association: americanpregnancy.org  [employer]: www.acog.org  Office on Women's Health: MightyReward.co.nz Contact a doctor if:  You have a fever.  You have mild cramps  or pressure in your lower belly.  You have a nagging pain in your belly area.  You vomit, or you have watery poop (diarrhea).  You have bad-smelling fluid coming from your vagina.  You have pain when you pee, or your pee smells bad.  You have a headache that does not go away when you take medicine.  You have changes in how you see, or you see spots in front of your eyes. Get help right away if:  Your water breaks.  You have regular contractions that are less than 5 minutes apart.  You are spotting or bleeding from your vagina.  You have very bad belly cramps or pain.  You have trouble breathing.  You have chest pain.  You faint.  You have not felt the baby move for the amount of time told by your doctor.  You have new or increased pain, swelling, or redness in an arm or leg. Summary  The third trimester is from week 28 through week 40 (months 7 through 9). This is the time when your unborn baby is growing very fast.  During this time, your discomfort may increase as you gain weight and as your baby grows.  Get ready for your baby to arrive by taking prenatal classes, buying a rear-facing car seat, and preparing the baby's room.  Get help right away if you are bleeding from your vagina, you have chest pain and trouble breathing, or you have not felt the baby move for the amount of time told by your doctor. This  information is not intended to replace advice given to you by your health care provider. Make sure you discuss any questions you have with your health care provider. Document Revised: 07/01/2019 Document Reviewed: 05/07/2019 Elsevier Patient Education  2021 Elsevier Inc.  

## 2020-02-18 NOTE — Progress Notes (Signed)
   PRENATAL VISIT NOTE  Subjective:  Joann Jordan is a 30 y.o. G3P2002 at [redacted]w[redacted]d being seen today for ongoing prenatal care.  She is currently monitored for the following issues for this low-risk pregnancy and has Supervision of high risk pregnancy, antepartum; Group B streptococcal carriage complicating pregnancy; Gastroesophageal reflux in pregnancy; and [redacted] weeks gestation of pregnancy on their problem list.  Patient doing well with no acute concerns today. She reports no complaints.  Contractions: Not present. Vag. Bleeding: None.  Movement: Present. Denies leaking of fluid.   The following portions of the patient's history were reviewed and updated as appropriate: allergies, current medications, past family history, past medical history, past social history, past surgical history and problem list. Problem list updated.  Objective:   Vitals:   02/18/20 0900  BP: 117/68  Pulse: 91  Weight: 186 lb (84.4 kg)    Fetal Status: Fetal Heart Rate (bpm): 143   Movement: Present     General:  Alert, oriented and cooperative. Patient is in no acute distress.  Skin: Skin is warm and dry. No rash noted.   Cardiovascular: Normal heart rate noted  Respiratory: Normal respiratory effort, no problems with respiration noted  Abdomen: Soft, gravid, appropriate for gestational age.  Pain/Pressure: Present     Pelvic: Cervical exam deferred        Extremities: Normal range of motion.  Edema: Trace  Mental Status:  Normal mood and affect. Normal behavior. Normal judgment and thought content.   Assessment and Plan:  Pregnancy: G3P2002 at [redacted]w[redacted]d  1. Supervision of high risk pregnancy, antepartum Pt will receive tdap today, she will get 2 hour GTT a later date, other labs drawn today  2. Group B streptococcal carriage complicating pregnancy Treat in labor  3. [redacted] weeks gestation of pregnancy   Preterm labor symptoms and general obstetric precautions including but not limited to vaginal bleeding,  contractions, leaking of fluid and fetal movement were reviewed in detail with the patient.  Please refer to After Visit Summary for other counseling recommendations.   Return in about 2 weeks (around 03/03/2020).   Mariel Aloe, MD

## 2020-02-19 LAB — CBC
Hematocrit: 32.3 % — ABNORMAL LOW (ref 34.0–46.6)
Hemoglobin: 11 g/dL — ABNORMAL LOW (ref 11.1–15.9)
MCH: 29.9 pg (ref 26.6–33.0)
MCHC: 34.1 g/dL (ref 31.5–35.7)
MCV: 88 fL (ref 79–97)
Platelets: 355 10*3/uL (ref 150–450)
RBC: 3.68 x10E6/uL — ABNORMAL LOW (ref 3.77–5.28)
RDW: 12.7 % (ref 11.7–15.4)
WBC: 7.2 10*3/uL (ref 3.4–10.8)

## 2020-02-19 LAB — RPR: RPR Ser Ql: NONREACTIVE

## 2020-02-19 LAB — HIV ANTIBODY (ROUTINE TESTING W REFLEX): HIV Screen 4th Generation wRfx: NONREACTIVE

## 2020-03-03 ENCOUNTER — Other Ambulatory Visit: Payer: Self-pay

## 2020-03-03 ENCOUNTER — Other Ambulatory Visit: Payer: Medicaid Other

## 2020-03-03 ENCOUNTER — Encounter: Payer: Medicaid Other | Admitting: Obstetrics and Gynecology

## 2020-03-03 ENCOUNTER — Ambulatory Visit (INDEPENDENT_AMBULATORY_CARE_PROVIDER_SITE_OTHER): Payer: Medicaid Other | Admitting: Obstetrics and Gynecology

## 2020-03-03 VITALS — BP 114/77 | HR 90

## 2020-03-03 DIAGNOSIS — O99619 Diseases of the digestive system complicating pregnancy, unspecified trimester: Secondary | ICD-10-CM

## 2020-03-03 DIAGNOSIS — O99820 Streptococcus B carrier state complicating pregnancy: Secondary | ICD-10-CM

## 2020-03-03 DIAGNOSIS — O0990 Supervision of high risk pregnancy, unspecified, unspecified trimester: Secondary | ICD-10-CM

## 2020-03-03 DIAGNOSIS — Z3A29 29 weeks gestation of pregnancy: Secondary | ICD-10-CM | POA: Insufficient documentation

## 2020-03-03 DIAGNOSIS — Z23 Encounter for immunization: Secondary | ICD-10-CM

## 2020-03-03 DIAGNOSIS — K219 Gastro-esophageal reflux disease without esophagitis: Secondary | ICD-10-CM

## 2020-03-03 NOTE — Addendum Note (Signed)
Addended by: Faythe Casa on: 03/03/2020 01:21 PM   Modules accepted: Orders

## 2020-03-03 NOTE — Progress Notes (Signed)
   PRENATAL VISIT NOTE  Subjective:  Joann Jordan is a 30 y.o. G3P2002 at [redacted]w[redacted]d being seen today for ongoing prenatal care.  She is currently monitored for the following issues for this low-risk pregnancy and has Supervision of high risk pregnancy, antepartum; Group B streptococcal carriage complicating pregnancy; Gastroesophageal reflux in pregnancy; [redacted] weeks gestation of pregnancy; [redacted] weeks gestation of pregnancy; and Influenza vaccination administered at current visit on their problem list.  Patient doing well with no acute concerns today. She reports no complaints.  Contractions: Not present. Vag. Bleeding: None.  Movement: Present. Denies leaking of fluid.   The following portions of the patient's history were reviewed and updated as appropriate: allergies, current medications, past family history, past medical history, past social history, past surgical history and problem list. Problem list updated.  Objective:   Vitals:   03/03/20 1134  BP: 114/77  Pulse: 90    Fetal Status: Fetal Heart Rate (bpm): 147 Fundal Height: 29 cm Movement: Present     General:  Alert, oriented and cooperative. Patient is in no acute distress.  Skin: Skin is warm and dry. No rash noted.   Cardiovascular: Normal heart rate noted  Respiratory: Normal respiratory effort, no problems with respiration noted  Abdomen: Soft, gravid, appropriate for gestational age.  Pain/Pressure: Present     Pelvic: Cervical exam deferred        Extremities: Normal range of motion.  Edema: Trace  Mental Status:  Normal mood and affect. Normal behavior. Normal judgment and thought content.   Assessment and Plan:  Pregnancy: G3P2002 at [redacted]w[redacted]d  1. Supervision of high risk pregnancy, antepartum Pt still working on getting covid vaccine 28 week labs today  2. Group B streptococcal carriage complicating pregnancy Treat in labor  3. [redacted] weeks gestation of pregnancy   4. Gastroesophageal reflux in pregnancy   5.  Influenza vaccination administered at current visit ptm received flu vaccine  Preterm labor symptoms and general obstetric precautions including but not limited to vaginal bleeding, contractions, leaking of fluid and fetal movement were reviewed in detail with the patient.  Please refer to After Visit Summary for other counseling recommendations.   Return in about 2 weeks (around 03/17/2020) for ROB, virtual.   Mariel Aloe, MD

## 2020-03-04 LAB — GLUCOSE TOLERANCE, 2 HOURS W/ 1HR
Glucose, 1 hour: 145 mg/dL (ref 65–179)
Glucose, 2 hour: 116 mg/dL (ref 65–152)
Glucose, Fasting: 83 mg/dL (ref 65–91)

## 2020-03-17 ENCOUNTER — Telehealth (INDEPENDENT_AMBULATORY_CARE_PROVIDER_SITE_OTHER): Payer: Medicaid Other | Admitting: Obstetrics and Gynecology

## 2020-03-17 ENCOUNTER — Encounter: Payer: Self-pay | Admitting: Obstetrics and Gynecology

## 2020-03-17 DIAGNOSIS — O0990 Supervision of high risk pregnancy, unspecified, unspecified trimester: Secondary | ICD-10-CM

## 2020-03-17 DIAGNOSIS — Z3A31 31 weeks gestation of pregnancy: Secondary | ICD-10-CM

## 2020-03-17 DIAGNOSIS — O99820 Streptococcus B carrier state complicating pregnancy: Secondary | ICD-10-CM

## 2020-03-17 DIAGNOSIS — O0993 Supervision of high risk pregnancy, unspecified, third trimester: Secondary | ICD-10-CM

## 2020-03-17 DIAGNOSIS — A749 Chlamydial infection, unspecified: Secondary | ICD-10-CM

## 2020-03-17 NOTE — Progress Notes (Signed)
   OBSTETRICS PRENATAL VIRTUAL VISIT ENCOUNTER NOTE  Provider location: [facility]   I connected with Joann Jordan on 03/17/20 at  9:55 AM EST by MyChart Video Encounter at home and verified that I am speaking with the correct person using two identifiers.   I discussed the limitations, risks, security and privacy concerns of performing an evaluation and management service virtually and the availability of in person appointments. I also discussed with the patient that there may be a patient responsible charge related to this service. The patient expressed understanding and agreed to proceed. Subjective:  Joann Jordan is a 30 y.o. G3P2002 at [redacted]w[redacted]d being seen today for ongoing prenatal care.  She is currently monitored for the following issues for this low-risk pregnancy and has Supervision of high risk pregnancy, antepartum; Group B streptococcal carriage complicating pregnancy; and Gastroesophageal reflux in pregnancy on their problem list.  Patient reports general discomforts of pregnancy.  Contractions: Irritability. Vag. Bleeding: None.  Movement: Present. Denies any leaking of fluid.   The following portions of the patient's history were reviewed and updated as appropriate: allergies, current medications, past family history, past medical history, past social history, past surgical history and problem list.   Objective:  There were no vitals filed for this visit.  Fetal Status:     Movement: Present     General:  Alert, oriented and cooperative. Patient is in no acute distress.  Respiratory: Normal respiratory effort, no problems with respiration noted  Mental Status: Normal mood and affect. Normal behavior. Normal judgment and thought content.  Rest of physical exam deferred due to type of encounter  Imaging: No results found.  Assessment and Plan:  Pregnancy: G3P2002 at [redacted]w[redacted]d 1. Supervision of high risk pregnancy,  antepartum Stable  2. Group B streptococcal carriage complicating pregnancy Tx while in labor  Preterm labor symptoms and general obstetric precautions including but not limited to vaginal bleeding, contractions, leaking of fluid and fetal movement were reviewed in detail with the patient. I discussed the assessment and treatment plan with the patient. The patient was provided an opportunity to ask questions and all were answered. The patient agreed with the plan and demonstrated an understanding of the instructions. The patient was advised to call back or seek an in-person office evaluation/go to [facility] at [facility] for any urgent or concerning symptoms. Please refer to After Visit Summary for other counseling recommendations.   I provided 8 minutes of face-to-face time during this encounter.  Return in about 2 weeks (around 03/31/2020) for virtual. any provider.  No future appointments.  Hermina Staggers, MD Center for [facility], [facility]

## 2020-03-17 NOTE — Progress Notes (Signed)
Pt states has been having a lot of pressure & pain in lower abdomen, was told most likely its ligament pain. Does not have BP Cuff handy will take later & record in BRx.

## 2020-03-31 ENCOUNTER — Telehealth (INDEPENDENT_AMBULATORY_CARE_PROVIDER_SITE_OTHER): Payer: Medicaid Other | Admitting: Family Medicine

## 2020-03-31 DIAGNOSIS — O99820 Streptococcus B carrier state complicating pregnancy: Secondary | ICD-10-CM

## 2020-03-31 DIAGNOSIS — O98813 Other maternal infectious and parasitic diseases complicating pregnancy, third trimester: Secondary | ICD-10-CM

## 2020-03-31 DIAGNOSIS — O0990 Supervision of high risk pregnancy, unspecified, unspecified trimester: Secondary | ICD-10-CM

## 2020-03-31 DIAGNOSIS — A749 Chlamydial infection, unspecified: Secondary | ICD-10-CM

## 2020-03-31 DIAGNOSIS — Z3A33 33 weeks gestation of pregnancy: Secondary | ICD-10-CM

## 2020-03-31 NOTE — Progress Notes (Signed)
Pt currently does not have access to BP Cuff, will take later on & record in BRx.

## 2020-03-31 NOTE — Progress Notes (Signed)
Ms. Joann Jordan, ambroise are scheduled for a virtual visit with your provider today.    Just as we do with appointments in the office, we must obtain your consent to participate.  Your consent will be active for this visit and any virtual visit you may have with one of our providers in the next 365 days.    If you have a MyChart account, I can also send a copy of this consent to you electronically.  All virtual visits are billed to your insurance company just like a traditional visit in the office.  As this is a virtual visit, video technology does not allow for your provider to perform a traditional examination.  This may limit your provider's ability to fully assess your condition.  If your provider identifies any concerns that need to be evaluated in person or the need to arrange testing such as labs, EKG, etc, we will make arrangements to do so.    Although advances in technology are sophisticated, we cannot ensure that it will always work on either your end or our end.  If the connection with a video visit is poor, we may have to switch to a telephone visit.  With either a video or telephone visit, we are not always able to ensure that we have a secure connection.   I need to obtain your verbal consent now.   Are you willing to proceed with your visit today?   Joann Jordan has provided verbal consent on 04/01/2020 for a virtual visit (video or telephone).   Alric Seton, MD 04/01/2020  9:10 PM    Virtual PRENATAL VISIT NOTE  Subjective:  Joann Jordan is a 30 y.o. G3P2002 at [redacted]w[redacted]d being seen today for ongoing prenatal care.  She is currently monitored for the following issues for this low-risk pregnancy and has Supervision of high risk pregnancy, antepartum; Group B streptococcal carriage complicating pregnancy; Gastroesophageal reflux in pregnancy; and Chlamydia on their problem list.  Patient reports no complaints.  Contractions: Not present. Vag. Bleeding: None.  Movement: Present.  Denies leaking of fluid.   The following portions of the patient's history were reviewed and updated as appropriate: allergies, current medications, past family history, past medical history, past social history, past surgical history and problem list.   Objective:  There were no vitals filed for this visit.  Fetal Status:     Movement: Present     General:  Alert, oriented and cooperative. Patient is in no acute distress.  Skin: Skin is warm and dry. No rash noted on visible skin surfaces.  Cardiovascular:   Respiratory: Normal respiratory effort, no problems with respiration noted  Abdomen: Not examined  Pain/Pressure: Absent     Pelvic: Cervical exam deferred        Extremities: Normal range of motion.  Edema: Trace  Mental Status: Normal mood and affect. Normal behavior. Normal judgment and thought content.   Assessment and Plan:  Pregnancy: G3P2002 at [redacted]w[redacted]d 1. Supervision of high risk pregnancy, antepartum -Doing well without complaints -Patient unable to take BP at time of visit, instructed to upload to babyscripts -Does not need GBS given GBS+ urine -discussed contraception, patient still undecided  2. GBS + Urine culture +11/23/19, PCN in labor  3. Chlamydia +11/23/19, patient states she was treated but threw up almost immediately after taking pills. Her partner has also been treated. Instructed next visit in person, needs TOC.  Preterm labor symptoms and general obstetric precautions including but not limited to vaginal bleeding, contractions,  leaking of fluid and fetal movement were reviewed in detail with the patient. Please refer to After Visit Summary for other counseling recommendations.   Return in about 2 weeks (around 04/14/2020) for LROB; in person.  No future appointments.  Alric Seton, MD

## 2020-04-04 ENCOUNTER — Telehealth: Payer: Self-pay | Admitting: Family Medicine

## 2020-04-04 NOTE — Telephone Encounter (Signed)
LVM for pt to call back to sch 2 week LOB

## 2020-04-11 ENCOUNTER — Telehealth: Payer: Self-pay | Admitting: *Deleted

## 2020-04-11 NOTE — Telephone Encounter (Signed)
Received call from [employer] representative who stated that pt had triggered an elevated BP of 140/83 on 3/5 @ 1210. Pt denied pre-e sx @ the time of BP.  I called pt and discussed this concern. She stated that she has checked her BP since 3/5 and result = 120/73. Pt stated that she checks her BP daily. I advised that since she does not have history of HTN, once per week is adequate unless she is experiencing sx of pre-e. Pt was asked to bring her machine to visit on 3/9 so that it can be checked for accuracy. She voiced understanding of instructions given.

## 2020-04-13 ENCOUNTER — Ambulatory Visit (INDEPENDENT_AMBULATORY_CARE_PROVIDER_SITE_OTHER): Payer: Medicaid Other | Admitting: Family Medicine

## 2020-04-13 ENCOUNTER — Other Ambulatory Visit (HOSPITAL_COMMUNITY)
Admission: RE | Admit: 2020-04-13 | Discharge: 2020-04-13 | Disposition: A | Discharge status: Home / Self Care | Payer: Medicaid Other | Source: Ambulatory Visit | Attending: Family Medicine | Admitting: Family Medicine

## 2020-04-13 ENCOUNTER — Other Ambulatory Visit: Payer: Self-pay

## 2020-04-13 ENCOUNTER — Encounter: Payer: Self-pay | Admitting: Family Medicine

## 2020-04-13 VITALS — BP 129/76 | HR 108 | Wt 186.5 lb

## 2020-04-13 DIAGNOSIS — O0990 Supervision of high risk pregnancy, unspecified, unspecified trimester: Secondary | ICD-10-CM | POA: Insufficient documentation

## 2020-04-13 DIAGNOSIS — A749 Chlamydial infection, unspecified: Secondary | ICD-10-CM

## 2020-04-13 DIAGNOSIS — O99820 Streptococcus B carrier state complicating pregnancy: Secondary | ICD-10-CM

## 2020-04-13 NOTE — Addendum Note (Signed)
Addended by: Gerome Apley on: 04/13/2020 02:46 PM   Modules accepted: Orders

## 2020-04-13 NOTE — Patient Instructions (Signed)
 Contraception Choices Contraception, also called birth control, refers to methods or devices that prevent pregnancy. Hormonal methods Contraceptive implant A contraceptive implant is a thin, plastic tube that contains a hormone that prevents pregnancy. It is different from an intrauterine device (IUD). It is inserted into the upper part of the arm by a health care provider. Implants can be effective for up to 3 years. Progestin-only injections Progestin-only injections are injections of progestin, a synthetic form of the hormone progesterone. They are given every 3 months by a health care provider. Birth control pills Birth control pills are pills that contain hormones that prevent pregnancy. They must be taken once a day, preferably at the same time each day. A prescription is needed to use this method of contraception. Birth control patch The birth control patch contains hormones that prevent pregnancy. It is placed on the skin and must be changed once a week for three weeks and removed on the fourth week. A prescription is needed to use this method of contraception. Vaginal ring A vaginal ring contains hormones that prevent pregnancy. It is placed in the vagina for three weeks and removed on the fourth week. After that, the process is repeated with a new ring. A prescription is needed to use this method of contraception. Emergency contraceptive Emergency contraceptives prevent pregnancy after unprotected sex. They come in pill form and can be taken up to 5 days after sex. They work best the sooner they are taken after having sex. Most emergency contraceptives are available without a prescription. This method should not be used as your only form of birth control.   Barrier methods Female condom A female condom is a thin sheath that is worn over the penis during sex. Condoms keep sperm from going inside a woman's body. They can be used with a sperm-killing substance (spermicide) to increase their  effectiveness. They should be thrown away after one use. Female condom A female condom is a soft, loose-fitting sheath that is put into the vagina before sex. The condom keeps sperm from going inside a woman's body. They should be thrown away after one use. Diaphragm A diaphragm is a soft, dome-shaped barrier. It is inserted into the vagina before sex, along with a spermicide. The diaphragm blocks sperm from entering the uterus, and the spermicide kills sperm. A diaphragm should be left in the vagina for 6-8 hours after sex and removed within 24 hours. A diaphragm is prescribed and fitted by a health care provider. A diaphragm should be replaced every 1-2 years, after giving birth, after gaining more than 15 lb (6.8 kg), and after pelvic surgery. Cervical cap A cervical cap is a round, soft latex or plastic cup that fits over the cervix. It is inserted into the vagina before sex, along with spermicide. It blocks sperm from entering the uterus. The cap should be left in place for 6-8 hours after sex and removed within 48 hours. A cervical cap must be prescribed and fitted by a health care provider. It should be replaced every 2 years. Sponge A sponge is a soft, circular piece of polyurethane foam with spermicide in it. The sponge helps block sperm from entering the uterus, and the spermicide kills sperm. To use it, you make it wet and then insert it into the vagina. It should be inserted before sex, left in for at least 6 hours after sex, and removed and thrown away within 30 hours. Spermicides Spermicides are chemicals that kill or block sperm from entering the   cervix and uterus. They can come as a cream, jelly, suppository, foam, or tablet. A spermicide should be inserted into the vagina with an applicator at least 10-15 minutes before sex to allow time for it to work. The process must be repeated every time you have sex. Spermicides do not require a prescription.   Intrauterine  contraception Intrauterine device (IUD) An IUD is a T-shaped device that is put in a woman's uterus. There are two types:  Hormone IUD.This type contains progestin, a synthetic form of the hormone progesterone. This type can stay in place for 3-5 years.  Copper IUD.This type is wrapped in copper wire. It can stay in place for 10 years. Permanent methods of contraception Female tubal ligation In this method, a woman's fallopian tubes are sealed, tied, or blocked during surgery to prevent eggs from traveling to the uterus. Hysteroscopic sterilization In this method, a small, flexible insert is placed into each fallopian tube. The inserts cause scar tissue to form in the fallopian tubes and block them, so sperm cannot reach an egg. The procedure takes about 3 months to be effective. Another form of birth control must be used during those 3 months. Female sterilization This is a procedure to tie off the tubes that carry sperm (vasectomy). After the procedure, the man can still ejaculate fluid (semen). Another form of birth control must be used for 3 months after the procedure. Natural planning methods Natural family planning In this method, a couple does not have sex on days when the woman could become pregnant. Calendar method In this method, the woman keeps track of the length of each menstrual cycle, identifies the days when pregnancy can happen, and does not have sex on those days. Ovulation method In this method, a couple avoids sex during ovulation. Symptothermal method This method involves not having sex during ovulation. The woman typically checks for ovulation by watching changes in her temperature and in the consistency of cervical mucus. Post-ovulation method In this method, a couple waits to have sex until after ovulation. Where to find more information  Centers for Disease Control and Prevention: www.cdc.gov Summary  Contraception, also called birth control, refers to methods or  devices that prevent pregnancy.  Hormonal methods of contraception include implants, injections, pills, patches, vaginal rings, and emergency contraceptives.  Barrier methods of contraception can include female condoms, female condoms, diaphragms, cervical caps, sponges, and spermicides.  There are two types of IUDs (intrauterine devices). An IUD can be put in a woman's uterus to prevent pregnancy for 3-5 years.  Permanent sterilization can be done through a procedure for males and females. Natural family planning methods involve nothaving sex on days when the woman could become pregnant. This information is not intended to replace advice given to you by your health care provider. Make sure you discuss any questions you have with your health care provider. Document Revised: 06/29/2019 Document Reviewed: 06/29/2019 Elsevier Patient Education  2021 Else[employer] Inc.   Breastfeeding  Choosing to breastfeed is one of the best decisions you can make for yourself and your baby. A change in hormones during pregnancy causes your breasts to make breast milk in your milk-producing glands. Hormones prevent breast milk from being released before your baby is born. They also prompt milk flow after birth. Once breastfeeding has begun, thoughts of your baby, as well as his or her sucking or crying, can stimulate the release of milk from your milk-producing glands. Benefits of breastfeeding Research shows that breastfeeding offers many health benefits   for infants and mothers. It also offers a cost-free and convenient way to feed your baby. For your baby  Your first milk (colostrum) helps your baby's digestive system to function better.  Special cells in your milk (antibodies) help your baby to fight off infections.  Breastfed babies are less likely to develop asthma, allergies, obesity, or type 2 diabetes. They are also at lower risk for sudden infant death syndrome (SIDS).  Nutrients in breast milk are better  able to meet your baby's needs compared to infant formula.  Breast milk improves your baby's brain development. For you  Breastfeeding helps to create a very special bond between you and your baby.  Breastfeeding is convenient. Breast milk costs nothing and is always available at the correct temperature.  Breastfeeding helps to burn calories. It helps you to lose the weight that you gained during pregnancy.  Breastfeeding makes your uterus return faster to its size before pregnancy. It also slows bleeding (lochia) after you give birth.  Breastfeeding helps to lower your risk of developing type 2 diabetes, osteoporosis, rheumatoid arthritis, cardiovascular disease, and breast, ovarian, uterine, and endometrial cancer later in life. Breastfeeding basics Starting breastfeeding  Find a comfortable place to sit or lie down, with your neck and back well-supported.  Place a pillow or a rolled-up blanket under your baby to bring him or her to the level of your breast (if you are seated). Nursing pillows are specially designed to help support your arms and your baby while you breastfeed.  Make sure that your baby's tummy (abdomen) is facing your abdomen.  Gently massage your breast. With your fingertips, massage from the outer edges of your breast inward toward the nipple. This encourages milk flow. If your milk flows slowly, you may need to continue this action during the feeding.  Support your breast with 4 fingers underneath and your thumb above your nipple (make the letter "C" with your hand). Make sure your fingers are well away from your nipple and your baby's mouth.  Stroke your baby's lips gently with your finger or nipple.  When your baby's mouth is open wide enough, quickly bring your baby to your breast, placing your entire nipple and as much of the areola as possible into your baby's mouth. The areola is the colored area around your nipple. ? More areola should be visible above your  baby's upper lip than below the lower lip. ? Your baby's lips should be opened and extended outward (flanged) to ensure an adequate, comfortable latch. ? Your baby's tongue should be between his or her lower gum and your breast.  Make sure that your baby's mouth is correctly positioned around your nipple (latched). Your baby's lips should create a seal on your breast and be turned out (everted).  It is common for your baby to suck about 2-3 minutes in order to start the flow of breast milk. Latching Teaching your baby how to latch onto your breast properly is very important. An improper latch can cause nipple pain, decreased milk supply, and poor weight gain in your baby. Also, if your baby is not latched onto your nipple properly, he or she may swallow some air during feeding. This can make your baby fussy. Burping your baby when you switch breasts during the feeding can help to get rid of the air. However, teaching your baby to latch on properly is still the best way to prevent fussiness from swallowing air while breastfeeding. Signs that your baby has successfully latched onto   your nipple  Silent tugging or silent sucking, without causing you pain. Infant's lips should be extended outward (flanged).  Swallowing heard between every 3-4 sucks once your milk has started to flow (after your let-down milk reflex occurs).  Muscle movement above and in front of his or her ears while sucking. Signs that your baby has not successfully latched onto your nipple  Sucking sounds or smacking sounds from your baby while breastfeeding.  Nipple pain. If you think your baby has not latched on correctly, slip your finger into the corner of your baby's mouth to break the suction and place it between your baby's gums. Attempt to start breastfeeding again. Signs of successful breastfeeding Signs from your baby  Your baby will gradually decrease the number of sucks or will completely stop sucking.  Your baby  will fall asleep.  Your baby's body will relax.  Your baby will retain a small amount of milk in his or her mouth.  Your baby will let go of your breast by himself or herself. Signs from you  Breasts that have increased in firmness, weight, and size 1-3 hours after feeding.  Breasts that are softer immediately after breastfeeding.  Increased milk volume, as well as a change in milk consistency and color by the fifth day of breastfeeding.  Nipples that are not sore, cracked, or bleeding. Signs that your baby is getting enough milk  Wetting at least 1-2 diapers during the first 24 hours after birth.  Wetting at least 5-6 diapers every 24 hours for the first week after birth. The urine should be clear or pale yellow by the age of 5 days.  Wetting 6-8 diapers every 24 hours as your baby continues to grow and develop.  At least 3 stools in a 24-hour period by the age of 5 days. The stool should be soft and yellow.  At least 3 stools in a 24-hour period by the age of 7 days. The stool should be seedy and yellow.  No loss of weight greater than 10% of birth weight during the first 3 days of life.  Average weight gain of 4-7 oz (113-198 g) per week after the age of 4 days.  Consistent daily weight gain by the age of 5 days, without weight loss after the age of 2 weeks. After a feeding, your baby may spit up a small amount of milk. This is normal. Breastfeeding frequency and duration Frequent feeding will help you make more milk and can prevent sore nipples and extremely full breasts (breast engorgement). Breastfeed when you feel the need to reduce the fullness of your breasts or when your baby shows signs of hunger. This is called "breastfeeding on demand." Signs that your baby is hungry include:  Increased alertness, activity, or restlessness.  Movement of the head from side to side.  Opening of the mouth when the corner of the mouth or cheek is stroked (rooting).  Increased  sucking sounds, smacking lips, cooing, sighing, or squeaking.  Hand-to-mouth movements and sucking on fingers or hands.  Fussing or crying. Avoid introducing a pacifier to your baby in the first 4-6 weeks after your baby is born. After this time, you may choose to use a pacifier. Research has shown that pacifier use during the first year of a baby's life decreases the risk of sudden infant death syndrome (SIDS). Allow your baby to feed on each breast as long as he or she wants. When your baby unlatches or falls asleep while feeding from the   first breast, offer the second breast. Because newborns are often sleepy in the first few weeks of life, you may need to awaken your baby to get him or her to feed. Breastfeeding times will vary from baby to baby. However, the following rules can serve as a guide to help you make sure that your baby is properly fed:  Newborns (babies 4 weeks of age or younger) may breastfeed every 1-3 hours.  Newborns should not go without breastfeeding for longer than 3 hours during the day or 5 hours during the night.  You should breastfeed your baby a minimum of 8 times in a 24-hour period. Breast milk pumping Pumping and storing breast milk allows you to make sure that your baby is exclusively fed your breast milk, even at times when you are unable to breastfeed. This is especially important if you go back to work while you are still breastfeeding, or if you are not able to be present during feedings. Your lactation consultant can help you find a method of pumping that works best for you and give you guidelines about how long it is safe to store breast milk.      Caring for your breasts while you breastfeed Nipples can become dry, cracked, and sore while breastfeeding. The following recommendations can help keep your breasts moisturized and healthy:  Avoid using soap on your nipples.  Wear a supportive bra designed especially for nursing. Avoid wearing underwire-style  bras or extremely tight bras (sports bras).  Air-dry your nipples for 3-4 minutes after each feeding.  Use only cotton bra pads to absorb leaked breast milk. Leaking of breast milk between feedings is normal.  Use lanolin on your nipples after breastfeeding. Lanolin helps to maintain your skin's normal moisture barrier. Pure lanolin is not harmful (not toxic) to your baby. You may also hand express a few drops of breast milk and gently massage that milk into your nipples and allow the milk to air-dry. In the first few weeks after giving birth, some women experience breast engorgement. Engorgement can make your breasts feel heavy, warm, and tender to the touch. Engorgement peaks within 3-5 days after you give birth. The following recommendations can help to ease engorgement:  Completely empty your breasts while breastfeeding or pumping. You may want to start by applying warm, moist heat (in the shower or with warm, water-soaked hand towels) just before feeding or pumping. This increases circulation and helps the milk flow. If your baby does not completely empty your breasts while breastfeeding, pump any extra milk after he or she is finished.  Apply ice packs to your breasts immediately after breastfeeding or pumping, unless this is too uncomfortable for you. To do this: ? Put ice in a plastic bag. ? Place a towel between your skin and the bag. ? Leave the ice on for 20 minutes, 2-3 times a day.  Make sure that your baby is latched on and positioned properly while breastfeeding. If engorgement persists after 48 hours of following these recommendations, contact your health care provider or a lactation consultant. Overall health care recommendations while breastfeeding  Eat 3 healthy meals and 3 snacks every day. Well-nourished mothers who are breastfeeding need an additional 450-500 calories a day. You can meet this requirement by increasing the amount of a balanced diet that you eat.  Drink  enough water to keep your urine pale yellow or clear.  Rest often, relax, and continue to take your prenatal vitamins to prevent fatigue, stress, and low   vitamin and mineral levels in your body (nutrient deficiencies).  Do not use any products that contain nicotine or tobacco, such as cigarettes and e-cigarettes. Your baby may be harmed by chemicals from cigarettes that pass into breast milk and exposure to secondhand smoke. If you need help quitting, ask your health care provider.  Avoid alcohol.  Do not use illegal drugs or marijuana.  Talk with your health care provider before taking any medicines. These include over-the-counter and prescription medicines as well as vitamins and herbal supplements. Some medicines that may be harmful to your baby can pass through breast milk.  It is possible to become pregnant while breastfeeding. If birth control is desired, ask your health care provider about options that will be safe while breastfeeding your baby. Where to find more information: [employer]: www.llli.org Contact a health care provider if:  You feel like you want to stop breastfeeding or have become frustrated with breastfeeding.  Your nipples are cracked or bleeding.  Your breasts are red, tender, or warm.  You have: ? Painful breasts or nipples. ? A swollen area on either breast. ? A fever or chills. ? Nausea or vomiting. ? Drainage other than breast milk from your nipples.  Your breasts do not become full before feedings by the fifth day after you give birth.  You feel sad and depressed.  Your baby is: ? Too sleepy to eat well. ? Having trouble sleeping. ? More than 1 week old and wetting fewer than 6 diapers in a 24-hour period. ? Not gaining weight by 5 days of age.  Your baby has fewer than 3 stools in a 24-hour period.  Your baby's skin or the white parts of his or her eyes become yellow. Get help right away if:  Your baby is overly tired  (lethargic) and does not want to wake up and feed.  Your baby develops an unexplained fever. Summary  Breastfeeding offers many health benefits for infant and mothers.  Try to breastfeed your infant when he or she shows early signs of hunger.  Gently tickle or stroke your baby's lips with your finger or nipple to allow the baby to open his or her mouth. Bring the baby to your breast. Make sure that much of the areola is in your baby's mouth. Offer one side and burp the baby before you offer the other side.  Talk with your health care provider or lactation consultant if you have questions or you face problems as you breastfeed. This information is not intended to replace advice given to you by your health care provider. Make sure you discuss any questions you have with your health care provider. Document Revised: 04/18/2017 Document Reviewed: 02/24/2016 Elsevier Patient Education  2021 Elsevier Inc.  

## 2020-04-13 NOTE — Progress Notes (Signed)
   Subjective:  Joann Jordan is a 30 y.o. G3P2002 at [redacted]w[redacted]d being seen today for ongoing prenatal care.  She is currently monitored for the following issues for this low-risk pregnancy and has Supervision of high risk pregnancy, antepartum; Group B streptococcal carriage complicating pregnancy; Gastroesophageal reflux in pregnancy; and Chlamydia on their problem list.  Patient reports no complaints.  Contractions: Not present. Vag. Bleeding: None.  Movement: Present. Denies leaking of fluid.   The following portions of the patient's history were reviewed and updated as appropriate: allergies, current medications, past family history, past medical history, past social history, past surgical history and problem list. Problem list updated.  Objective:   Vitals:   04/13/20 1350  Weight: 186 lb 8 oz (84.6 kg)    Fetal Status:     Movement: Present     General:  Alert, oriented and cooperative. Patient is in no acute distress.  Skin: Skin is warm and dry. No rash noted.   Cardiovascular: Normal heart rate noted  Respiratory: Normal respiratory effort, no problems with respiration noted  Abdomen: Soft, gravid, appropriate for gestational age. Pain/Pressure: Present     Pelvic: Vag. Bleeding: None     Cervical exam deferred        Extremities: Normal range of motion.  Edema: Trace  Mental Status: Normal mood and affect. Normal behavior. Normal judgment and thought content.   Urinalysis:      Assessment and Plan:  Pregnancy: G3P2002 at [redacted]w[redacted]d  1. Supervision of high risk pregnancy, antepartum BP and FHR normal BP at home occasionally elevated, BP here with both her cuff and our cuff is normal, reviewed warning signs Discussed contraception, reviewed options, signed tubal papers, will continue to consider  2. Group B streptococcal carriage complicating pregnancy Treatment in labor  3. Chlamydia No TOC since +result in 11-2019, reports she threw up the medicine about 1 hour after  receiving  Will retest today, tx if needed  Preterm labor symptoms and general obstetric precautions including but not limited to vaginal bleeding, contractions, leaking of fluid and fetal movement were reviewed in detail with the patient. Please refer to After Visit Summary for other counseling recommendations.  Return in 2 weeks (on 04/27/2020) for [facility], ob visit.   Venora Maples, MD

## 2020-04-13 NOTE — Progress Notes (Addendum)
States has felt shaky at times at home and has high bp readings at home including 151/91, 145 86. Brought her home bp machine to compare readings. Reading with pt. Machine 129/ 76. Denies headaches but c/o sometimes sees shadow or silver lines. BTL consent signed. Kinya Meine,RN

## 2020-04-14 LAB — GC/CHLAMYDIA PROBE AMP (~~LOC~~) NOT AT ARMC
Chlamydia: NEGATIVE
Comment: NEGATIVE
Comment: NORMAL
Neisseria Gonorrhea: NEGATIVE

## 2020-04-18 ENCOUNTER — Encounter: Payer: Self-pay | Admitting: *Deleted

## 2020-04-25 ENCOUNTER — Other Ambulatory Visit: Payer: Self-pay | Admitting: Lactation Services

## 2020-04-25 MED ORDER — ONDANSETRON 4 MG PO TBDP: 4.0000 mg | ORAL_TABLET | Freq: Three times a day (TID) | ORAL | 0 refills | Status: DC | PRN

## 2020-04-26 ENCOUNTER — Other Ambulatory Visit: Payer: Self-pay

## 2020-04-26 MED ORDER — ONDANSETRON 4 MG PO TBDP
4.0000 mg | ORAL_TABLET | Freq: Three times a day (TID) | ORAL | 0 refills | Status: AC | PRN
Start: 2020-04-26 — End: ?

## 2020-04-27 ENCOUNTER — Ambulatory Visit (INDEPENDENT_AMBULATORY_CARE_PROVIDER_SITE_OTHER): Payer: Medicaid Other | Admitting: Family Medicine

## 2020-04-27 ENCOUNTER — Other Ambulatory Visit: Payer: Self-pay

## 2020-04-27 ENCOUNTER — Encounter: Payer: Self-pay | Admitting: Family Medicine

## 2020-04-27 VITALS — BP 111/71 | HR 102 | Wt 188.6 lb

## 2020-04-27 DIAGNOSIS — A749 Chlamydial infection, unspecified: Secondary | ICD-10-CM

## 2020-04-27 DIAGNOSIS — K219 Gastro-esophageal reflux disease without esophagitis: Secondary | ICD-10-CM

## 2020-04-27 DIAGNOSIS — O99820 Streptococcus B carrier state complicating pregnancy: Secondary | ICD-10-CM

## 2020-04-27 DIAGNOSIS — O0990 Supervision of high risk pregnancy, unspecified, unspecified trimester: Secondary | ICD-10-CM

## 2020-04-27 DIAGNOSIS — O99619 Diseases of the digestive system complicating pregnancy, unspecified trimester: Secondary | ICD-10-CM

## 2020-04-27 NOTE — Patient Instructions (Signed)
 Contraception Choices Contraception, also called birth control, refers to methods or devices that prevent pregnancy. Hormonal methods Contraceptive implant A contraceptive implant is a thin, plastic tube that contains a hormone that prevents pregnancy. It is different from an intrauterine device (IUD). It is inserted into the upper part of the arm by a health care provider. Implants can be effective for up to 3 years. Progestin-only injections Progestin-only injections are injections of progestin, a synthetic form of the hormone progesterone. They are given every 3 months by a health care provider. Birth control pills Birth control pills are pills that contain hormones that prevent pregnancy. They must be taken once a day, preferably at the same time each day. A prescription is needed to use this method of contraception. Birth control patch The birth control patch contains hormones that prevent pregnancy. It is placed on the skin and must be changed once a week for three weeks and removed on the fourth week. A prescription is needed to use this method of contraception. Vaginal ring A vaginal ring contains hormones that prevent pregnancy. It is placed in the vagina for three weeks and removed on the fourth week. After that, the process is repeated with a new ring. A prescription is needed to use this method of contraception. Emergency contraceptive Emergency contraceptives prevent pregnancy after unprotected sex. They come in pill form and can be taken up to 5 days after sex. They work best the sooner they are taken after having sex. Most emergency contraceptives are available without a prescription. This method should not be used as your only form of birth control.   Barrier methods Female condom A female condom is a thin sheath that is worn over the penis during sex. Condoms keep sperm from going inside a woman's body. They can be used with a sperm-killing substance (spermicide) to increase their  effectiveness. They should be thrown away after one use. Female condom A female condom is a soft, loose-fitting sheath that is put into the vagina before sex. The condom keeps sperm from going inside a woman's body. They should be thrown away after one use. Diaphragm A diaphragm is a soft, dome-shaped barrier. It is inserted into the vagina before sex, along with a spermicide. The diaphragm blocks sperm from entering the uterus, and the spermicide kills sperm. A diaphragm should be left in the vagina for 6-8 hours after sex and removed within 24 hours. A diaphragm is prescribed and fitted by a health care provider. A diaphragm should be replaced every 1-2 years, after giving birth, after gaining more than 15 lb (6.8 kg), and after pelvic surgery. Cervical cap A cervical cap is a round, soft latex or plastic cup that fits over the cervix. It is inserted into the vagina before sex, along with spermicide. It blocks sperm from entering the uterus. The cap should be left in place for 6-8 hours after sex and removed within 48 hours. A cervical cap must be prescribed and fitted by a health care provider. It should be replaced every 2 years. Sponge A sponge is a soft, circular piece of polyurethane foam with spermicide in it. The sponge helps block sperm from entering the uterus, and the spermicide kills sperm. To use it, you make it wet and then insert it into the vagina. It should be inserted before sex, left in for at least 6 hours after sex, and removed and thrown away within 30 hours. Spermicides Spermicides are chemicals that kill or block sperm from entering the   cervix and uterus. They can come as a cream, jelly, suppository, foam, or tablet. A spermicide should be inserted into the vagina with an applicator at least 10-15 minutes before sex to allow time for it to work. The process must be repeated every time you have sex. Spermicides do not require a prescription.   Intrauterine  contraception Intrauterine device (IUD) An IUD is a T-shaped device that is put in a woman's uterus. There are two types:  Hormone IUD.This type contains progestin, a synthetic form of the hormone progesterone. This type can stay in place for 3-5 years.  Copper IUD.This type is wrapped in copper wire. It can stay in place for 10 years. Permanent methods of contraception Female tubal ligation In this method, a woman's fallopian tubes are sealed, tied, or blocked during surgery to prevent eggs from traveling to the uterus. Hysteroscopic sterilization In this method, a small, flexible insert is placed into each fallopian tube. The inserts cause scar tissue to form in the fallopian tubes and block them, so sperm cannot reach an egg. The procedure takes about 3 months to be effective. Another form of birth control must be used during those 3 months. Female sterilization This is a procedure to tie off the tubes that carry sperm (vasectomy). After the procedure, the man can still ejaculate fluid (semen). Another form of birth control must be used for 3 months after the procedure. Natural planning methods Natural family planning In this method, a couple does not have sex on days when the woman could become pregnant. Calendar method In this method, the woman keeps track of the length of each menstrual cycle, identifies the days when pregnancy can happen, and does not have sex on those days. Ovulation method In this method, a couple avoids sex during ovulation. Symptothermal method This method involves not having sex during ovulation. The woman typically checks for ovulation by watching changes in her temperature and in the consistency of cervical mucus. Post-ovulation method In this method, a couple waits to have sex until after ovulation. Where to find more information  Centers for Disease Control and Prevention: www.cdc.gov Summary  Contraception, also called birth control, refers to methods or  devices that prevent pregnancy.  Hormonal methods of contraception include implants, injections, pills, patches, vaginal rings, and emergency contraceptives.  Barrier methods of contraception can include female condoms, female condoms, diaphragms, cervical caps, sponges, and spermicides.  There are two types of IUDs (intrauterine devices). An IUD can be put in a woman's uterus to prevent pregnancy for 3-5 years.  Permanent sterilization can be done through a procedure for males and females. Natural family planning methods involve nothaving sex on days when the woman could become pregnant. This information is not intended to replace advice given to you by your health care provider. Make sure you discuss any questions you have with your health care provider. Document Revised: 06/29/2019 Document Reviewed: 06/29/2019 Elsevier Patient Education  2021 Else[employer] Inc.   Breastfeeding  Choosing to breastfeed is one of the best decisions you can make for yourself and your baby. A change in hormones during pregnancy causes your breasts to make breast milk in your milk-producing glands. Hormones prevent breast milk from being released before your baby is born. They also prompt milk flow after birth. Once breastfeeding has begun, thoughts of your baby, as well as his or her sucking or crying, can stimulate the release of milk from your milk-producing glands. Benefits of breastfeeding Research shows that breastfeeding offers many health benefits   for infants and mothers. It also offers a cost-free and convenient way to feed your baby. For your baby  Your first milk (colostrum) helps your baby's digestive system to function better.  Special cells in your milk (antibodies) help your baby to fight off infections.  Breastfed babies are less likely to develop asthma, allergies, obesity, or type 2 diabetes. They are also at lower risk for sudden infant death syndrome (SIDS).  Nutrients in breast milk are better  able to meet your baby's needs compared to infant formula.  Breast milk improves your baby's brain development. For you  Breastfeeding helps to create a very special bond between you and your baby.  Breastfeeding is convenient. Breast milk costs nothing and is always available at the correct temperature.  Breastfeeding helps to burn calories. It helps you to lose the weight that you gained during pregnancy.  Breastfeeding makes your uterus return faster to its size before pregnancy. It also slows bleeding (lochia) after you give birth.  Breastfeeding helps to lower your risk of developing type 2 diabetes, osteoporosis, rheumatoid arthritis, cardiovascular disease, and breast, ovarian, uterine, and endometrial cancer later in life. Breastfeeding basics Starting breastfeeding  Find a comfortable place to sit or lie down, with your neck and back well-supported.  Place a pillow or a rolled-up blanket under your baby to bring him or her to the level of your breast (if you are seated). Nursing pillows are specially designed to help support your arms and your baby while you breastfeed.  Make sure that your baby's tummy (abdomen) is facing your abdomen.  Gently massage your breast. With your fingertips, massage from the outer edges of your breast inward toward the nipple. This encourages milk flow. If your milk flows slowly, you may need to continue this action during the feeding.  Support your breast with 4 fingers underneath and your thumb above your nipple (make the letter "C" with your hand). Make sure your fingers are well away from your nipple and your baby's mouth.  Stroke your baby's lips gently with your finger or nipple.  When your baby's mouth is open wide enough, quickly bring your baby to your breast, placing your entire nipple and as much of the areola as possible into your baby's mouth. The areola is the colored area around your nipple. ? More areola should be visible above your  baby's upper lip than below the lower lip. ? Your baby's lips should be opened and extended outward (flanged) to ensure an adequate, comfortable latch. ? Your baby's tongue should be between his or her lower gum and your breast.  Make sure that your baby's mouth is correctly positioned around your nipple (latched). Your baby's lips should create a seal on your breast and be turned out (everted).  It is common for your baby to suck about 2-3 minutes in order to start the flow of breast milk. Latching Teaching your baby how to latch onto your breast properly is very important. An improper latch can cause nipple pain, decreased milk supply, and poor weight gain in your baby. Also, if your baby is not latched onto your nipple properly, he or she may swallow some air during feeding. This can make your baby fussy. Burping your baby when you switch breasts during the feeding can help to get rid of the air. However, teaching your baby to latch on properly is still the best way to prevent fussiness from swallowing air while breastfeeding. Signs that your baby has successfully latched onto   your nipple  Silent tugging or silent sucking, without causing you pain. Infant's lips should be extended outward (flanged).  Swallowing heard between every 3-4 sucks once your milk has started to flow (after your let-down milk reflex occurs).  Muscle movement above and in front of his or her ears while sucking. Signs that your baby has not successfully latched onto your nipple  Sucking sounds or smacking sounds from your baby while breastfeeding.  Nipple pain. If you think your baby has not latched on correctly, slip your finger into the corner of your baby's mouth to break the suction and place it between your baby's gums. Attempt to start breastfeeding again. Signs of successful breastfeeding Signs from your baby  Your baby will gradually decrease the number of sucks or will completely stop sucking.  Your baby  will fall asleep.  Your baby's body will relax.  Your baby will retain a small amount of milk in his or her mouth.  Your baby will let go of your breast by himself or herself. Signs from you  Breasts that have increased in firmness, weight, and size 1-3 hours after feeding.  Breasts that are softer immediately after breastfeeding.  Increased milk volume, as well as a change in milk consistency and color by the fifth day of breastfeeding.  Nipples that are not sore, cracked, or bleeding. Signs that your baby is getting enough milk  Wetting at least 1-2 diapers during the first 24 hours after birth.  Wetting at least 5-6 diapers every 24 hours for the first week after birth. The urine should be clear or pale yellow by the age of 5 days.  Wetting 6-8 diapers every 24 hours as your baby continues to grow and develop.  At least 3 stools in a 24-hour period by the age of 5 days. The stool should be soft and yellow.  At least 3 stools in a 24-hour period by the age of 7 days. The stool should be seedy and yellow.  No loss of weight greater than 10% of birth weight during the first 3 days of life.  Average weight gain of 4-7 oz (113-198 g) per week after the age of 4 days.  Consistent daily weight gain by the age of 5 days, without weight loss after the age of 2 weeks. After a feeding, your baby may spit up a small amount of milk. This is normal. Breastfeeding frequency and duration Frequent feeding will help you make more milk and can prevent sore nipples and extremely full breasts (breast engorgement). Breastfeed when you feel the need to reduce the fullness of your breasts or when your baby shows signs of hunger. This is called "breastfeeding on demand." Signs that your baby is hungry include:  Increased alertness, activity, or restlessness.  Movement of the head from side to side.  Opening of the mouth when the corner of the mouth or cheek is stroked (rooting).  Increased  sucking sounds, smacking lips, cooing, sighing, or squeaking.  Hand-to-mouth movements and sucking on fingers or hands.  Fussing or crying. Avoid introducing a pacifier to your baby in the first 4-6 weeks after your baby is born. After this time, you may choose to use a pacifier. Research has shown that pacifier use during the first year of a baby's life decreases the risk of sudden infant death syndrome (SIDS). Allow your baby to feed on each breast as long as he or she wants. When your baby unlatches or falls asleep while feeding from the   first breast, offer the second breast. Because newborns are often sleepy in the first few weeks of life, you may need to awaken your baby to get him or her to feed. Breastfeeding times will vary from baby to baby. However, the following rules can serve as a guide to help you make sure that your baby is properly fed:  Newborns (babies 4 weeks of age or younger) may breastfeed every 1-3 hours.  Newborns should not go without breastfeeding for longer than 3 hours during the day or 5 hours during the night.  You should breastfeed your baby a minimum of 8 times in a 24-hour period. Breast milk pumping Pumping and storing breast milk allows you to make sure that your baby is exclusively fed your breast milk, even at times when you are unable to breastfeed. This is especially important if you go back to work while you are still breastfeeding, or if you are not able to be present during feedings. Your lactation consultant can help you find a method of pumping that works best for you and give you guidelines about how long it is safe to store breast milk.      Caring for your breasts while you breastfeed Nipples can become dry, cracked, and sore while breastfeeding. The following recommendations can help keep your breasts moisturized and healthy:  Avoid using soap on your nipples.  Wear a supportive bra designed especially for nursing. Avoid wearing underwire-style  bras or extremely tight bras (sports bras).  Air-dry your nipples for 3-4 minutes after each feeding.  Use only cotton bra pads to absorb leaked breast milk. Leaking of breast milk between feedings is normal.  Use lanolin on your nipples after breastfeeding. Lanolin helps to maintain your skin's normal moisture barrier. Pure lanolin is not harmful (not toxic) to your baby. You may also hand express a few drops of breast milk and gently massage that milk into your nipples and allow the milk to air-dry. In the first few weeks after giving birth, some women experience breast engorgement. Engorgement can make your breasts feel heavy, warm, and tender to the touch. Engorgement peaks within 3-5 days after you give birth. The following recommendations can help to ease engorgement:  Completely empty your breasts while breastfeeding or pumping. You may want to start by applying warm, moist heat (in the shower or with warm, water-soaked hand towels) just before feeding or pumping. This increases circulation and helps the milk flow. If your baby does not completely empty your breasts while breastfeeding, pump any extra milk after he or she is finished.  Apply ice packs to your breasts immediately after breastfeeding or pumping, unless this is too uncomfortable for you. To do this: ? Put ice in a plastic bag. ? Place a towel between your skin and the bag. ? Leave the ice on for 20 minutes, 2-3 times a day.  Make sure that your baby is latched on and positioned properly while breastfeeding. If engorgement persists after 48 hours of following these recommendations, contact your health care provider or a lactation consultant. Overall health care recommendations while breastfeeding  Eat 3 healthy meals and 3 snacks every day. Well-nourished mothers who are breastfeeding need an additional 450-500 calories a day. You can meet this requirement by increasing the amount of a balanced diet that you eat.  Drink  enough water to keep your urine pale yellow or clear.  Rest often, relax, and continue to take your prenatal vitamins to prevent fatigue, stress, and low   vitamin and mineral levels in your body (nutrient deficiencies).  Do not use any products that contain nicotine or tobacco, such as cigarettes and e-cigarettes. Your baby may be harmed by chemicals from cigarettes that pass into breast milk and exposure to secondhand smoke. If you need help quitting, ask your health care provider.  Avoid alcohol.  Do not use illegal drugs or marijuana.  Talk with your health care provider before taking any medicines. These include over-the-counter and prescription medicines as well as vitamins and herbal supplements. Some medicines that may be harmful to your baby can pass through breast milk.  It is possible to become pregnant while breastfeeding. If birth control is desired, ask your health care provider about options that will be safe while breastfeeding your baby. Where to find more information: [employer]: www.llli.org Contact a health care provider if:  You feel like you want to stop breastfeeding or have become frustrated with breastfeeding.  Your nipples are cracked or bleeding.  Your breasts are red, tender, or warm.  You have: ? Painful breasts or nipples. ? A swollen area on either breast. ? A fever or chills. ? Nausea or vomiting. ? Drainage other than breast milk from your nipples.  Your breasts do not become full before feedings by the fifth day after you give birth.  You feel sad and depressed.  Your baby is: ? Too sleepy to eat well. ? Having trouble sleeping. ? More than 1 week old and wetting fewer than 6 diapers in a 24-hour period. ? Not gaining weight by 5 days of age.  Your baby has fewer than 3 stools in a 24-hour period.  Your baby's skin or the white parts of his or her eyes become yellow. Get help right away if:  Your baby is overly tired  (lethargic) and does not want to wake up and feed.  Your baby develops an unexplained fever. Summary  Breastfeeding offers many health benefits for infant and mothers.  Try to breastfeed your infant when he or she shows early signs of hunger.  Gently tickle or stroke your baby's lips with your finger or nipple to allow the baby to open his or her mouth. Bring the baby to your breast. Make sure that much of the areola is in your baby's mouth. Offer one side and burp the baby before you offer the other side.  Talk with your health care provider or lactation consultant if you have questions or you face problems as you breastfeed. This information is not intended to replace advice given to you by your health care provider. Make sure you discuss any questions you have with your health care provider. Document Revised: 04/18/2017 Document Reviewed: 02/24/2016 Elsevier Patient Education  2021 Elsevier Inc.  

## 2020-04-27 NOTE — Progress Notes (Signed)
   Subjective:  Joann Jordan is a 30 y.o. G3P2002 at [redacted]w[redacted]d being seen today for ongoing prenatal care.  She is currently monitored for the following issues for this low-risk pregnancy and has Supervision of high risk pregnancy, antepartum; Group B streptococcal carriage complicating pregnancy; Gastroesophageal reflux in pregnancy; and Chlamydia on their problem list.  Patient reports no complaints.  Contractions: Irregular. Vag. Bleeding: None.  Movement: Present. Denies leaking of fluid.   The following portions of the patient's history were reviewed and updated as appropriate: allergies, current medications, past family history, past medical history, past social history, past surgical history and problem list. Problem list updated.  Objective:   Vitals:   04/27/20 1613  BP: 111/71  Pulse: (!) 102  Weight: 188 lb 9.6 oz (85.5 kg)    Fetal Status: Fetal Heart Rate (bpm): 135   Movement: Present     General:  Alert, oriented and cooperative. Patient is in no acute distress.  Skin: Skin is warm and dry. No rash noted.   Cardiovascular: Normal heart rate noted  Respiratory: Normal respiratory effort, no problems with respiration noted  Abdomen: Soft, gravid, appropriate for gestational age. Pain/Pressure: Present     Pelvic: Vag. Bleeding: None     Cervical exam deferred        Extremities: Normal range of motion.  Edema: Trace  Mental Status: Normal mood and affect. Normal behavior. Normal judgment and thought content.   Urinalysis:      Assessment and Plan:  Pregnancy: G3P2002 at [redacted]w[redacted]d  1. Supervision of high risk pregnancy, antepartum BP and FHR normal Worried about timing of delivery, no family in town, most live 1.5 hours away Discussed we can schedule IOL at next visit for around 40 weeks  2. Group B streptococcal carriage complicating pregnancy Treatment in labor  3. Chlamydia TOC normal from last visit    Term labor symptoms and general obstetric precautions  including but not limited to vaginal bleeding, contractions, leaking of fluid and fetal movement were reviewed in detail with the patient. Please refer to After Visit Summary for other counseling recommendations.  Return in 1 week (on 05/04/2020) for [facility], ob visit.   Venora Maples, MD

## 2020-05-04 ENCOUNTER — Encounter: Payer: Self-pay | Admitting: Family Medicine

## 2020-05-04 ENCOUNTER — Ambulatory Visit (INDEPENDENT_AMBULATORY_CARE_PROVIDER_SITE_OTHER): Payer: Medicaid Other | Admitting: Family Medicine

## 2020-05-04 ENCOUNTER — Other Ambulatory Visit: Payer: Self-pay

## 2020-05-04 VITALS — BP 112/68 | HR 99 | Wt 189.3 lb

## 2020-05-04 DIAGNOSIS — K219 Gastro-esophageal reflux disease without esophagitis: Secondary | ICD-10-CM

## 2020-05-04 DIAGNOSIS — O99820 Streptococcus B carrier state complicating pregnancy: Secondary | ICD-10-CM

## 2020-05-04 DIAGNOSIS — O99619 Diseases of the digestive system complicating pregnancy, unspecified trimester: Secondary | ICD-10-CM

## 2020-05-04 DIAGNOSIS — O0990 Supervision of high risk pregnancy, unspecified, unspecified trimester: Secondary | ICD-10-CM

## 2020-05-04 NOTE — Progress Notes (Signed)
   Subjective:  Joann Jordan is a 30 y.o. G3P2002 at [redacted]w[redacted]d being seen today for ongoing prenatal care.  She is currently monitored for the following issues for this low-risk pregnancy and has Supervision of high risk pregnancy, antepartum; Group B streptococcal carriage complicating pregnancy; Gastroesophageal reflux in pregnancy; and Chlamydia on their problem list.  Patient reports no complaints.  Contractions: Irregular. Vag. Bleeding: None.  Movement: Present. Denies leaking of fluid.   The following portions of the patient's history were reviewed and updated as appropriate: allergies, current medications, past family history, past medical history, past social history, past surgical history and problem list. Problem list updated.  Objective:   Vitals:   05/04/20 1459  BP: 112/68  Pulse: 99  Weight: 189 lb 4.8 oz (85.9 kg)    Fetal Status: Fetal Heart Rate (bpm): 139   Movement: Present     General:  Alert, oriented and cooperative. Patient is in no acute distress.  Skin: Skin is warm and dry. No rash noted.   Cardiovascular: Normal heart rate noted  Respiratory: Normal respiratory effort, no problems with respiration noted  Abdomen: Soft, gravid, appropriate for gestational age. Pain/Pressure: Present     Pelvic: Vag. Bleeding: None     Cervical exam deferred        Extremities: Normal range of motion.  Edema: Trace  Mental Status: Normal mood and affect. Normal behavior. Normal judgment and thought content.   Urinalysis:      Assessment and Plan:  Pregnancy: G3P2002 at [redacted]w[redacted]d  1. Supervision of high risk pregnancy, antepartum BP and FHR normal Per discussion last visit would like to schedule IOL at 40 weeks as she and her partner do not have anyone in town to take care of their children without significantly advanced notice IOL form completed and sent for 05/18/2020 at 0800 Orders placed  2. Group B streptococcal carriage complicating pregnancy Penicillin orders placed  for admission  3. Gastroesophageal reflux in pregnancy Symptoms controlled  Preterm labor symptoms and general obstetric precautions including but not limited to vaginal bleeding, contractions, leaking of fluid and fetal movement were reviewed in detail with the patient. Please refer to After Visit Summary for other counseling recommendations.  Return in 1 week (on 05/11/2020).   Venora Maples, MD

## 2020-05-04 NOTE — Patient Instructions (Signed)
 Contraception Choices Contraception, also called birth control, refers to methods or devices that prevent pregnancy. Hormonal methods Contraceptive implant A contraceptive implant is a thin, plastic tube that contains a hormone that prevents pregnancy. It is different from an intrauterine device (IUD). It is inserted into the upper part of the arm by a health care provider. Implants can be effective for up to 3 years. Progestin-only injections Progestin-only injections are injections of progestin, a synthetic form of the hormone progesterone. They are given every 3 months by a health care provider. Birth control pills Birth control pills are pills that contain hormones that prevent pregnancy. They must be taken once a day, preferably at the same time each day. A prescription is needed to use this method of contraception. Birth control patch The birth control patch contains hormones that prevent pregnancy. It is placed on the skin and must be changed once a week for three weeks and removed on the fourth week. A prescription is needed to use this method of contraception. Vaginal ring A vaginal ring contains hormones that prevent pregnancy. It is placed in the vagina for three weeks and removed on the fourth week. After that, the process is repeated with a new ring. A prescription is needed to use this method of contraception. Emergency contraceptive Emergency contraceptives prevent pregnancy after unprotected sex. They come in pill form and can be taken up to 5 days after sex. They work best the sooner they are taken after having sex. Most emergency contraceptives are available without a prescription. This method should not be used as your only form of birth control.   Barrier methods Female condom A female condom is a thin sheath that is worn over the penis during sex. Condoms keep sperm from going inside a woman's body. They can be used with a sperm-killing substance (spermicide) to increase their  effectiveness. They should be thrown away after one use. Female condom A female condom is a soft, loose-fitting sheath that is put into the vagina before sex. The condom keeps sperm from going inside a woman's body. They should be thrown away after one use. Diaphragm A diaphragm is a soft, dome-shaped barrier. It is inserted into the vagina before sex, along with a spermicide. The diaphragm blocks sperm from entering the uterus, and the spermicide kills sperm. A diaphragm should be left in the vagina for 6-8 hours after sex and removed within 24 hours. A diaphragm is prescribed and fitted by a health care provider. A diaphragm should be replaced every 1-2 years, after giving birth, after gaining more than 15 lb (6.8 kg), and after pelvic surgery. Cervical cap A cervical cap is a round, soft latex or plastic cup that fits over the cervix. It is inserted into the vagina before sex, along with spermicide. It blocks sperm from entering the uterus. The cap should be left in place for 6-8 hours after sex and removed within 48 hours. A cervical cap must be prescribed and fitted by a health care provider. It should be replaced every 2 years. Sponge A sponge is a soft, circular piece of polyurethane foam with spermicide in it. The sponge helps block sperm from entering the uterus, and the spermicide kills sperm. To use it, you make it wet and then insert it into the vagina. It should be inserted before sex, left in for at least 6 hours after sex, and removed and thrown away within 30 hours. Spermicides Spermicides are chemicals that kill or block sperm from entering the   cervix and uterus. They can come as a cream, jelly, suppository, foam, or tablet. A spermicide should be inserted into the vagina with an applicator at least 10-15 minutes before sex to allow time for it to work. The process must be repeated every time you have sex. Spermicides do not require a prescription.   Intrauterine  contraception Intrauterine device (IUD) An IUD is a T-shaped device that is put in a woman's uterus. There are two types:  Hormone IUD.This type contains progestin, a synthetic form of the hormone progesterone. This type can stay in place for 3-5 years.  Copper IUD.This type is wrapped in copper wire. It can stay in place for 10 years. Permanent methods of contraception Female tubal ligation In this method, a woman's fallopian tubes are sealed, tied, or blocked during surgery to prevent eggs from traveling to the uterus. Hysteroscopic sterilization In this method, a small, flexible insert is placed into each fallopian tube. The inserts cause scar tissue to form in the fallopian tubes and block them, so sperm cannot reach an egg. The procedure takes about 3 months to be effective. Another form of birth control must be used during those 3 months. Female sterilization This is a procedure to tie off the tubes that carry sperm (vasectomy). After the procedure, the man can still ejaculate fluid (semen). Another form of birth control must be used for 3 months after the procedure. Natural planning methods Natural family planning In this method, a couple does not have sex on days when the woman could become pregnant. Calendar method In this method, the woman keeps track of the length of each menstrual cycle, identifies the days when pregnancy can happen, and does not have sex on those days. Ovulation method In this method, a couple avoids sex during ovulation. Symptothermal method This method involves not having sex during ovulation. The woman typically checks for ovulation by watching changes in her temperature and in the consistency of cervical mucus. Post-ovulation method In this method, a couple waits to have sex until after ovulation. Where to find more information  Centers for Disease Control and Prevention: www.cdc.gov Summary  Contraception, also called birth control, refers to methods or  devices that prevent pregnancy.  Hormonal methods of contraception include implants, injections, pills, patches, vaginal rings, and emergency contraceptives.  Barrier methods of contraception can include female condoms, female condoms, diaphragms, cervical caps, sponges, and spermicides.  There are two types of IUDs (intrauterine devices). An IUD can be put in a woman's uterus to prevent pregnancy for 3-5 years.  Permanent sterilization can be done through a procedure for males and females. Natural family planning methods involve nothaving sex on days when the woman could become pregnant. This information is not intended to replace advice given to you by your health care provider. Make sure you discuss any questions you have with your health care provider. Document Revised: 06/29/2019 Document Reviewed: 06/29/2019 Elsevier Patient Education  2021 Else[employer] Inc.   Breastfeeding  Choosing to breastfeed is one of the best decisions you can make for yourself and your baby. A change in hormones during pregnancy causes your breasts to make breast milk in your milk-producing glands. Hormones prevent breast milk from being released before your baby is born. They also prompt milk flow after birth. Once breastfeeding has begun, thoughts of your baby, as well as his or her sucking or crying, can stimulate the release of milk from your milk-producing glands. Benefits of breastfeeding Research shows that breastfeeding offers many health benefits   for infants and mothers. It also offers a cost-free and convenient way to feed your baby. For your baby  Your first milk (colostrum) helps your baby's digestive system to function better.  Special cells in your milk (antibodies) help your baby to fight off infections.  Breastfed babies are less likely to develop asthma, allergies, obesity, or type 2 diabetes. They are also at lower risk for sudden infant death syndrome (SIDS).  Nutrients in breast milk are better  able to meet your baby's needs compared to infant formula.  Breast milk improves your baby's brain development. For you  Breastfeeding helps to create a very special bond between you and your baby.  Breastfeeding is convenient. Breast milk costs nothing and is always available at the correct temperature.  Breastfeeding helps to burn calories. It helps you to lose the weight that you gained during pregnancy.  Breastfeeding makes your uterus return faster to its size before pregnancy. It also slows bleeding (lochia) after you give birth.  Breastfeeding helps to lower your risk of developing type 2 diabetes, osteoporosis, rheumatoid arthritis, cardiovascular disease, and breast, ovarian, uterine, and endometrial cancer later in life. Breastfeeding basics Starting breastfeeding  Find a comfortable place to sit or lie down, with your neck and back well-supported.  Place a pillow or a rolled-up blanket under your baby to bring him or her to the level of your breast (if you are seated). Nursing pillows are specially designed to help support your arms and your baby while you breastfeed.  Make sure that your baby's tummy (abdomen) is facing your abdomen.  Gently massage your breast. With your fingertips, massage from the outer edges of your breast inward toward the nipple. This encourages milk flow. If your milk flows slowly, you may need to continue this action during the feeding.  Support your breast with 4 fingers underneath and your thumb above your nipple (make the letter "C" with your hand). Make sure your fingers are well away from your nipple and your baby's mouth.  Stroke your baby's lips gently with your finger or nipple.  When your baby's mouth is open wide enough, quickly bring your baby to your breast, placing your entire nipple and as much of the areola as possible into your baby's mouth. The areola is the colored area around your nipple. ? More areola should be visible above your  baby's upper lip than below the lower lip. ? Your baby's lips should be opened and extended outward (flanged) to ensure an adequate, comfortable latch. ? Your baby's tongue should be between his or her lower gum and your breast.  Make sure that your baby's mouth is correctly positioned around your nipple (latched). Your baby's lips should create a seal on your breast and be turned out (everted).  It is common for your baby to suck about 2-3 minutes in order to start the flow of breast milk. Latching Teaching your baby how to latch onto your breast properly is very important. An improper latch can cause nipple pain, decreased milk supply, and poor weight gain in your baby. Also, if your baby is not latched onto your nipple properly, he or she may swallow some air during feeding. This can make your baby fussy. Burping your baby when you switch breasts during the feeding can help to get rid of the air. However, teaching your baby to latch on properly is still the best way to prevent fussiness from swallowing air while breastfeeding. Signs that your baby has successfully latched onto   your nipple  Silent tugging or silent sucking, without causing you pain. Infant's lips should be extended outward (flanged).  Swallowing heard between every 3-4 sucks once your milk has started to flow (after your let-down milk reflex occurs).  Muscle movement above and in front of his or her ears while sucking. Signs that your baby has not successfully latched onto your nipple  Sucking sounds or smacking sounds from your baby while breastfeeding.  Nipple pain. If you think your baby has not latched on correctly, slip your finger into the corner of your baby's mouth to break the suction and place it between your baby's gums. Attempt to start breastfeeding again. Signs of successful breastfeeding Signs from your baby  Your baby will gradually decrease the number of sucks or will completely stop sucking.  Your baby  will fall asleep.  Your baby's body will relax.  Your baby will retain a small amount of milk in his or her mouth.  Your baby will let go of your breast by himself or herself. Signs from you  Breasts that have increased in firmness, weight, and size 1-3 hours after feeding.  Breasts that are softer immediately after breastfeeding.  Increased milk volume, as well as a change in milk consistency and color by the fifth day of breastfeeding.  Nipples that are not sore, cracked, or bleeding. Signs that your baby is getting enough milk  Wetting at least 1-2 diapers during the first 24 hours after birth.  Wetting at least 5-6 diapers every 24 hours for the first week after birth. The urine should be clear or pale yellow by the age of 5 days.  Wetting 6-8 diapers every 24 hours as your baby continues to grow and develop.  At least 3 stools in a 24-hour period by the age of 5 days. The stool should be soft and yellow.  At least 3 stools in a 24-hour period by the age of 7 days. The stool should be seedy and yellow.  No loss of weight greater than 10% of birth weight during the first 3 days of life.  Average weight gain of 4-7 oz (113-198 g) per week after the age of 4 days.  Consistent daily weight gain by the age of 5 days, without weight loss after the age of 2 weeks. After a feeding, your baby may spit up a small amount of milk. This is normal. Breastfeeding frequency and duration Frequent feeding will help you make more milk and can prevent sore nipples and extremely full breasts (breast engorgement). Breastfeed when you feel the need to reduce the fullness of your breasts or when your baby shows signs of hunger. This is called "breastfeeding on demand." Signs that your baby is hungry include:  Increased alertness, activity, or restlessness.  Movement of the head from side to side.  Opening of the mouth when the corner of the mouth or cheek is stroked (rooting).  Increased  sucking sounds, smacking lips, cooing, sighing, or squeaking.  Hand-to-mouth movements and sucking on fingers or hands.  Fussing or crying. Avoid introducing a pacifier to your baby in the first 4-6 weeks after your baby is born. After this time, you may choose to use a pacifier. Research has shown that pacifier use during the first year of a baby's life decreases the risk of sudden infant death syndrome (SIDS). Allow your baby to feed on each breast as long as he or she wants. When your baby unlatches or falls asleep while feeding from the   first breast, offer the second breast. Because newborns are often sleepy in the first few weeks of life, you may need to awaken your baby to get him or her to feed. Breastfeeding times will vary from baby to baby. However, the following rules can serve as a guide to help you make sure that your baby is properly fed:  Newborns (babies 4 weeks of age or younger) may breastfeed every 1-3 hours.  Newborns should not go without breastfeeding for longer than 3 hours during the day or 5 hours during the night.  You should breastfeed your baby a minimum of 8 times in a 24-hour period. Breast milk pumping Pumping and storing breast milk allows you to make sure that your baby is exclusively fed your breast milk, even at times when you are unable to breastfeed. This is especially important if you go back to work while you are still breastfeeding, or if you are not able to be present during feedings. Your lactation consultant can help you find a method of pumping that works best for you and give you guidelines about how long it is safe to store breast milk.      Caring for your breasts while you breastfeed Nipples can become dry, cracked, and sore while breastfeeding. The following recommendations can help keep your breasts moisturized and healthy:  Avoid using soap on your nipples.  Wear a supportive bra designed especially for nursing. Avoid wearing underwire-style  bras or extremely tight bras (sports bras).  Air-dry your nipples for 3-4 minutes after each feeding.  Use only cotton bra pads to absorb leaked breast milk. Leaking of breast milk between feedings is normal.  Use lanolin on your nipples after breastfeeding. Lanolin helps to maintain your skin's normal moisture barrier. Pure lanolin is not harmful (not toxic) to your baby. You may also hand express a few drops of breast milk and gently massage that milk into your nipples and allow the milk to air-dry. In the first few weeks after giving birth, some women experience breast engorgement. Engorgement can make your breasts feel heavy, warm, and tender to the touch. Engorgement peaks within 3-5 days after you give birth. The following recommendations can help to ease engorgement:  Completely empty your breasts while breastfeeding or pumping. You may want to start by applying warm, moist heat (in the shower or with warm, water-soaked hand towels) just before feeding or pumping. This increases circulation and helps the milk flow. If your baby does not completely empty your breasts while breastfeeding, pump any extra milk after he or she is finished.  Apply ice packs to your breasts immediately after breastfeeding or pumping, unless this is too uncomfortable for you. To do this: ? Put ice in a plastic bag. ? Place a towel between your skin and the bag. ? Leave the ice on for 20 minutes, 2-3 times a day.  Make sure that your baby is latched on and positioned properly while breastfeeding. If engorgement persists after 48 hours of following these recommendations, contact your health care provider or a lactation consultant. Overall health care recommendations while breastfeeding  Eat 3 healthy meals and 3 snacks every day. Well-nourished mothers who are breastfeeding need an additional 450-500 calories a day. You can meet this requirement by increasing the amount of a balanced diet that you eat.  Drink  enough water to keep your urine pale yellow or clear.  Rest often, relax, and continue to take your prenatal vitamins to prevent fatigue, stress, and low   vitamin and mineral levels in your body (nutrient deficiencies).  Do not use any products that contain nicotine or tobacco, such as cigarettes and e-cigarettes. Your baby may be harmed by chemicals from cigarettes that pass into breast milk and exposure to secondhand smoke. If you need help quitting, ask your health care provider.  Avoid alcohol.  Do not use illegal drugs or marijuana.  Talk with your health care provider before taking any medicines. These include over-the-counter and prescription medicines as well as vitamins and herbal supplements. Some medicines that may be harmful to your baby can pass through breast milk.  It is possible to become pregnant while breastfeeding. If birth control is desired, ask your health care provider about options that will be safe while breastfeeding your baby. Where to find more information: [employer]: www.llli.org Contact a health care provider if:  You feel like you want to stop breastfeeding or have become frustrated with breastfeeding.  Your nipples are cracked or bleeding.  Your breasts are red, tender, or warm.  You have: ? Painful breasts or nipples. ? A swollen area on either breast. ? A fever or chills. ? Nausea or vomiting. ? Drainage other than breast milk from your nipples.  Your breasts do not become full before feedings by the fifth day after you give birth.  You feel sad and depressed.  Your baby is: ? Too sleepy to eat well. ? Having trouble sleeping. ? More than 1 week old and wetting fewer than 6 diapers in a 24-hour period. ? Not gaining weight by 5 days of age.  Your baby has fewer than 3 stools in a 24-hour period.  Your baby's skin or the white parts of his or her eyes become yellow. Get help right away if:  Your baby is overly tired  (lethargic) and does not want to wake up and feed.  Your baby develops an unexplained fever. Summary  Breastfeeding offers many health benefits for infant and mothers.  Try to breastfeed your infant when he or she shows early signs of hunger.  Gently tickle or stroke your baby's lips with your finger or nipple to allow the baby to open his or her mouth. Bring the baby to your breast. Make sure that much of the areola is in your baby's mouth. Offer one side and burp the baby before you offer the other side.  Talk with your health care provider or lactation consultant if you have questions or you face problems as you breastfeed. This information is not intended to replace advice given to you by your health care provider. Make sure you discuss any questions you have with your health care provider. Document Revised: 04/18/2017 Document Reviewed: 02/24/2016 Elsevier Patient Education  2021 Elsevier Inc.  

## 2020-05-05 ENCOUNTER — Telehealth (HOSPITAL_COMMUNITY): Payer: Self-pay | Admitting: *Deleted

## 2020-05-05 NOTE — Telephone Encounter (Signed)
Preadmission screen  

## 2020-05-06 ENCOUNTER — Telehealth (HOSPITAL_COMMUNITY): Payer: Self-pay | Admitting: *Deleted

## 2020-05-06 ENCOUNTER — Encounter (HOSPITAL_COMMUNITY): Payer: Self-pay | Admitting: *Deleted

## 2020-05-06 NOTE — Telephone Encounter (Signed)
Preadmission screen  

## 2020-05-11 ENCOUNTER — Encounter: Payer: Medicaid Other | Admitting: Certified Nurse Midwife

## 2020-05-13 ENCOUNTER — Encounter: Payer: Self-pay | Admitting: Family Medicine

## 2020-05-13 ENCOUNTER — Other Ambulatory Visit: Payer: Self-pay

## 2020-05-13 ENCOUNTER — Ambulatory Visit (INDEPENDENT_AMBULATORY_CARE_PROVIDER_SITE_OTHER): Payer: Medicaid Other | Admitting: Family Medicine

## 2020-05-13 VITALS — BP 113/71 | HR 81 | Wt 186.7 lb

## 2020-05-13 DIAGNOSIS — O0990 Supervision of high risk pregnancy, unspecified, unspecified trimester: Secondary | ICD-10-CM

## 2020-05-13 DIAGNOSIS — O99820 Streptococcus B carrier state complicating pregnancy: Secondary | ICD-10-CM

## 2020-05-13 NOTE — Patient Instructions (Signed)
 Contraception Choices Contraception, also called birth control, refers to methods or devices that prevent pregnancy. Hormonal methods Contraceptive implant A contraceptive implant is a thin, plastic tube that contains a hormone that prevents pregnancy. It is different from an intrauterine device (IUD). It is inserted into the upper part of the arm by a health care provider. Implants can be effective for up to 3 years. Progestin-only injections Progestin-only injections are injections of progestin, a synthetic form of the hormone progesterone. They are given every 3 months by a health care provider. Birth control pills Birth control pills are pills that contain hormones that prevent pregnancy. They must be taken once a day, preferably at the same time each day. A prescription is needed to use this method of contraception. Birth control patch The birth control patch contains hormones that prevent pregnancy. It is placed on the skin and must be changed once a week for three weeks and removed on the fourth week. A prescription is needed to use this method of contraception. Vaginal ring A vaginal ring contains hormones that prevent pregnancy. It is placed in the vagina for three weeks and removed on the fourth week. After that, the process is repeated with a new ring. A prescription is needed to use this method of contraception. Emergency contraceptive Emergency contraceptives prevent pregnancy after unprotected sex. They come in pill form and can be taken up to 5 days after sex. They work best the sooner they are taken after having sex. Most emergency contraceptives are available without a prescription. This method should not be used as your only form of birth control.   Barrier methods Female condom A female condom is a thin sheath that is worn over the penis during sex. Condoms keep sperm from going inside a woman's body. They can be used with a sperm-killing substance (spermicide) to increase their  effectiveness. They should be thrown away after one use. Female condom A female condom is a soft, loose-fitting sheath that is put into the vagina before sex. The condom keeps sperm from going inside a woman's body. They should be thrown away after one use. Diaphragm A diaphragm is a soft, dome-shaped barrier. It is inserted into the vagina before sex, along with a spermicide. The diaphragm blocks sperm from entering the uterus, and the spermicide kills sperm. A diaphragm should be left in the vagina for 6-8 hours after sex and removed within 24 hours. A diaphragm is prescribed and fitted by a health care provider. A diaphragm should be replaced every 1-2 years, after giving birth, after gaining more than 15 lb (6.8 kg), and after pelvic surgery. Cervical cap A cervical cap is a round, soft latex or plastic cup that fits over the cervix. It is inserted into the vagina before sex, along with spermicide. It blocks sperm from entering the uterus. The cap should be left in place for 6-8 hours after sex and removed within 48 hours. A cervical cap must be prescribed and fitted by a health care provider. It should be replaced every 2 years. Sponge A sponge is a soft, circular piece of polyurethane foam with spermicide in it. The sponge helps block sperm from entering the uterus, and the spermicide kills sperm. To use it, you make it wet and then insert it into the vagina. It should be inserted before sex, left in for at least 6 hours after sex, and removed and thrown away within 30 hours. Spermicides Spermicides are chemicals that kill or block sperm from entering the   cervix and uterus. They can come as a cream, jelly, suppository, foam, or tablet. A spermicide should be inserted into the vagina with an applicator at least 10-15 minutes before sex to allow time for it to work. The process must be repeated every time you have sex. Spermicides do not require a prescription.   Intrauterine  contraception Intrauterine device (IUD) An IUD is a T-shaped device that is put in a woman's uterus. There are two types:  Hormone IUD.This type contains progestin, a synthetic form of the hormone progesterone. This type can stay in place for 3-5 years.  Copper IUD.This type is wrapped in copper wire. It can stay in place for 10 years. Permanent methods of contraception Female tubal ligation In this method, a woman's fallopian tubes are sealed, tied, or blocked during surgery to prevent eggs from traveling to the uterus. Hysteroscopic sterilization In this method, a small, flexible insert is placed into each fallopian tube. The inserts cause scar tissue to form in the fallopian tubes and block them, so sperm cannot reach an egg. The procedure takes about 3 months to be effective. Another form of birth control must be used during those 3 months. Female sterilization This is a procedure to tie off the tubes that carry sperm (vasectomy). After the procedure, the man can still ejaculate fluid (semen). Another form of birth control must be used for 3 months after the procedure. Natural planning methods Natural family planning In this method, a couple does not have sex on days when the woman could become pregnant. Calendar method In this method, the woman keeps track of the length of each menstrual cycle, identifies the days when pregnancy can happen, and does not have sex on those days. Ovulation method In this method, a couple avoids sex during ovulation. Symptothermal method This method involves not having sex during ovulation. The woman typically checks for ovulation by watching changes in her temperature and in the consistency of cervical mucus. Post-ovulation method In this method, a couple waits to have sex until after ovulation. Where to find more information  Centers for Disease Control and Prevention: www.cdc.gov Summary  Contraception, also called birth control, refers to methods or  devices that prevent pregnancy.  Hormonal methods of contraception include implants, injections, pills, patches, vaginal rings, and emergency contraceptives.  Barrier methods of contraception can include female condoms, female condoms, diaphragms, cervical caps, sponges, and spermicides.  There are two types of IUDs (intrauterine devices). An IUD can be put in a woman's uterus to prevent pregnancy for 3-5 years.  Permanent sterilization can be done through a procedure for males and females. Natural family planning methods involve nothaving sex on days when the woman could become pregnant. This information is not intended to replace advice given to you by your health care provider. Make sure you discuss any questions you have with your health care provider. Document Revised: 06/29/2019 Document Reviewed: 06/29/2019 Elsevier Patient Education  2021 Else[employer] Inc.   Breastfeeding  Choosing to breastfeed is one of the best decisions you can make for yourself and your baby. A change in hormones during pregnancy causes your breasts to make breast milk in your milk-producing glands. Hormones prevent breast milk from being released before your baby is born. They also prompt milk flow after birth. Once breastfeeding has begun, thoughts of your baby, as well as his or her sucking or crying, can stimulate the release of milk from your milk-producing glands. Benefits of breastfeeding Research shows that breastfeeding offers many health benefits   for infants and mothers. It also offers a cost-free and convenient way to feed your baby. For your baby  Your first milk (colostrum) helps your baby's digestive system to function better.  Special cells in your milk (antibodies) help your baby to fight off infections.  Breastfed babies are less likely to develop asthma, allergies, obesity, or type 2 diabetes. They are also at lower risk for sudden infant death syndrome (SIDS).  Nutrients in breast milk are better  able to meet your baby's needs compared to infant formula.  Breast milk improves your baby's brain development. For you  Breastfeeding helps to create a very special bond between you and your baby.  Breastfeeding is convenient. Breast milk costs nothing and is always available at the correct temperature.  Breastfeeding helps to burn calories. It helps you to lose the weight that you gained during pregnancy.  Breastfeeding makes your uterus return faster to its size before pregnancy. It also slows bleeding (lochia) after you give birth.  Breastfeeding helps to lower your risk of developing type 2 diabetes, osteoporosis, rheumatoid arthritis, cardiovascular disease, and breast, ovarian, uterine, and endometrial cancer later in life. Breastfeeding basics Starting breastfeeding  Find a comfortable place to sit or lie down, with your neck and back well-supported.  Place a pillow or a rolled-up blanket under your baby to bring him or her to the level of your breast (if you are seated). Nursing pillows are specially designed to help support your arms and your baby while you breastfeed.  Make sure that your baby's tummy (abdomen) is facing your abdomen.  Gently massage your breast. With your fingertips, massage from the outer edges of your breast inward toward the nipple. This encourages milk flow. If your milk flows slowly, you may need to continue this action during the feeding.  Support your breast with 4 fingers underneath and your thumb above your nipple (make the letter "C" with your hand). Make sure your fingers are well away from your nipple and your baby's mouth.  Stroke your baby's lips gently with your finger or nipple.  When your baby's mouth is open wide enough, quickly bring your baby to your breast, placing your entire nipple and as much of the areola as possible into your baby's mouth. The areola is the colored area around your nipple. ? More areola should be visible above your  baby's upper lip than below the lower lip. ? Your baby's lips should be opened and extended outward (flanged) to ensure an adequate, comfortable latch. ? Your baby's tongue should be between his or her lower gum and your breast.  Make sure that your baby's mouth is correctly positioned around your nipple (latched). Your baby's lips should create a seal on your breast and be turned out (everted).  It is common for your baby to suck about 2-3 minutes in order to start the flow of breast milk. Latching Teaching your baby how to latch onto your breast properly is very important. An improper latch can cause nipple pain, decreased milk supply, and poor weight gain in your baby. Also, if your baby is not latched onto your nipple properly, he or she may swallow some air during feeding. This can make your baby fussy. Burping your baby when you switch breasts during the feeding can help to get rid of the air. However, teaching your baby to latch on properly is still the best way to prevent fussiness from swallowing air while breastfeeding. Signs that your baby has successfully latched onto   your nipple  Silent tugging or silent sucking, without causing you pain. Infant's lips should be extended outward (flanged).  Swallowing heard between every 3-4 sucks once your milk has started to flow (after your let-down milk reflex occurs).  Muscle movement above and in front of his or her ears while sucking. Signs that your baby has not successfully latched onto your nipple  Sucking sounds or smacking sounds from your baby while breastfeeding.  Nipple pain. If you think your baby has not latched on correctly, slip your finger into the corner of your baby's mouth to break the suction and place it between your baby's gums. Attempt to start breastfeeding again. Signs of successful breastfeeding Signs from your baby  Your baby will gradually decrease the number of sucks or will completely stop sucking.  Your baby  will fall asleep.  Your baby's body will relax.  Your baby will retain a small amount of milk in his or her mouth.  Your baby will let go of your breast by himself or herself. Signs from you  Breasts that have increased in firmness, weight, and size 1-3 hours after feeding.  Breasts that are softer immediately after breastfeeding.  Increased milk volume, as well as a change in milk consistency and color by the fifth day of breastfeeding.  Nipples that are not sore, cracked, or bleeding. Signs that your baby is getting enough milk  Wetting at least 1-2 diapers during the first 24 hours after birth.  Wetting at least 5-6 diapers every 24 hours for the first week after birth. The urine should be clear or pale yellow by the age of 5 days.  Wetting 6-8 diapers every 24 hours as your baby continues to grow and develop.  At least 3 stools in a 24-hour period by the age of 5 days. The stool should be soft and yellow.  At least 3 stools in a 24-hour period by the age of 7 days. The stool should be seedy and yellow.  No loss of weight greater than 10% of birth weight during the first 3 days of life.  Average weight gain of 4-7 oz (113-198 g) per week after the age of 4 days.  Consistent daily weight gain by the age of 5 days, without weight loss after the age of 2 weeks. After a feeding, your baby may spit up a small amount of milk. This is normal. Breastfeeding frequency and duration Frequent feeding will help you make more milk and can prevent sore nipples and extremely full breasts (breast engorgement). Breastfeed when you feel the need to reduce the fullness of your breasts or when your baby shows signs of hunger. This is called "breastfeeding on demand." Signs that your baby is hungry include:  Increased alertness, activity, or restlessness.  Movement of the head from side to side.  Opening of the mouth when the corner of the mouth or cheek is stroked (rooting).  Increased  sucking sounds, smacking lips, cooing, sighing, or squeaking.  Hand-to-mouth movements and sucking on fingers or hands.  Fussing or crying. Avoid introducing a pacifier to your baby in the first 4-6 weeks after your baby is born. After this time, you may choose to use a pacifier. Research has shown that pacifier use during the first year of a baby's life decreases the risk of sudden infant death syndrome (SIDS). Allow your baby to feed on each breast as long as he or she wants. When your baby unlatches or falls asleep while feeding from the   first breast, offer the second breast. Because newborns are often sleepy in the first few weeks of life, you may need to awaken your baby to get him or her to feed. Breastfeeding times will vary from baby to baby. However, the following rules can serve as a guide to help you make sure that your baby is properly fed:  Newborns (babies 4 weeks of age or younger) may breastfeed every 1-3 hours.  Newborns should not go without breastfeeding for longer than 3 hours during the day or 5 hours during the night.  You should breastfeed your baby a minimum of 8 times in a 24-hour period. Breast milk pumping Pumping and storing breast milk allows you to make sure that your baby is exclusively fed your breast milk, even at times when you are unable to breastfeed. This is especially important if you go back to work while you are still breastfeeding, or if you are not able to be present during feedings. Your lactation consultant can help you find a method of pumping that works best for you and give you guidelines about how long it is safe to store breast milk.      Caring for your breasts while you breastfeed Nipples can become dry, cracked, and sore while breastfeeding. The following recommendations can help keep your breasts moisturized and healthy:  Avoid using soap on your nipples.  Wear a supportive bra designed especially for nursing. Avoid wearing underwire-style  bras or extremely tight bras (sports bras).  Air-dry your nipples for 3-4 minutes after each feeding.  Use only cotton bra pads to absorb leaked breast milk. Leaking of breast milk between feedings is normal.  Use lanolin on your nipples after breastfeeding. Lanolin helps to maintain your skin's normal moisture barrier. Pure lanolin is not harmful (not toxic) to your baby. You may also hand express a few drops of breast milk and gently massage that milk into your nipples and allow the milk to air-dry. In the first few weeks after giving birth, some women experience breast engorgement. Engorgement can make your breasts feel heavy, warm, and tender to the touch. Engorgement peaks within 3-5 days after you give birth. The following recommendations can help to ease engorgement:  Completely empty your breasts while breastfeeding or pumping. You may want to start by applying warm, moist heat (in the shower or with warm, water-soaked hand towels) just before feeding or pumping. This increases circulation and helps the milk flow. If your baby does not completely empty your breasts while breastfeeding, pump any extra milk after he or she is finished.  Apply ice packs to your breasts immediately after breastfeeding or pumping, unless this is too uncomfortable for you. To do this: ? Put ice in a plastic bag. ? Place a towel between your skin and the bag. ? Leave the ice on for 20 minutes, 2-3 times a day.  Make sure that your baby is latched on and positioned properly while breastfeeding. If engorgement persists after 48 hours of following these recommendations, contact your health care provider or a lactation consultant. Overall health care recommendations while breastfeeding  Eat 3 healthy meals and 3 snacks every day. Well-nourished mothers who are breastfeeding need an additional 450-500 calories a day. You can meet this requirement by increasing the amount of a balanced diet that you eat.  Drink  enough water to keep your urine pale yellow or clear.  Rest often, relax, and continue to take your prenatal vitamins to prevent fatigue, stress, and low   vitamin and mineral levels in your body (nutrient deficiencies).  Do not use any products that contain nicotine or tobacco, such as cigarettes and e-cigarettes. Your baby may be harmed by chemicals from cigarettes that pass into breast milk and exposure to secondhand smoke. If you need help quitting, ask your health care provider.  Avoid alcohol.  Do not use illegal drugs or marijuana.  Talk with your health care provider before taking any medicines. These include over-the-counter and prescription medicines as well as vitamins and herbal supplements. Some medicines that may be harmful to your baby can pass through breast milk.  It is possible to become pregnant while breastfeeding. If birth control is desired, ask your health care provider about options that will be safe while breastfeeding your baby. Where to find more information: [employer]: www.llli.org Contact a health care provider if:  You feel like you want to stop breastfeeding or have become frustrated with breastfeeding.  Your nipples are cracked or bleeding.  Your breasts are red, tender, or warm.  You have: ? Painful breasts or nipples. ? A swollen area on either breast. ? A fever or chills. ? Nausea or vomiting. ? Drainage other than breast milk from your nipples.  Your breasts do not become full before feedings by the fifth day after you give birth.  You feel sad and depressed.  Your baby is: ? Too sleepy to eat well. ? Having trouble sleeping. ? More than 1 week old and wetting fewer than 6 diapers in a 24-hour period. ? Not gaining weight by 5 days of age.  Your baby has fewer than 3 stools in a 24-hour period.  Your baby's skin or the white parts of his or her eyes become yellow. Get help right away if:  Your baby is overly tired  (lethargic) and does not want to wake up and feed.  Your baby develops an unexplained fever. Summary  Breastfeeding offers many health benefits for infant and mothers.  Try to breastfeed your infant when he or she shows early signs of hunger.  Gently tickle or stroke your baby's lips with your finger or nipple to allow the baby to open his or her mouth. Bring the baby to your breast. Make sure that much of the areola is in your baby's mouth. Offer one side and burp the baby before you offer the other side.  Talk with your health care provider or lactation consultant if you have questions or you face problems as you breastfeed. This information is not intended to replace advice given to you by your health care provider. Make sure you discuss any questions you have with your health care provider. Document Revised: 04/18/2017 Document Reviewed: 02/24/2016 Elsevier Patient Education  2021 Elsevier Inc.  

## 2020-05-13 NOTE — Progress Notes (Signed)
   Subjective:  Joann Jordan is a 30 y.o. G3P2002 at [redacted]w[redacted]d being seen today for ongoing prenatal care.  She is currently monitored for the following issues for this low-risk pregnancy and has Supervision of high risk pregnancy, antepartum; Group B streptococcal carriage complicating pregnancy; Gastroesophageal reflux in pregnancy; and Chlamydia on their problem list.  Patient reports no complaints.  Contractions: Irritability. Vag. Bleeding: None.  Movement: Present. Denies leaking of fluid.   The following portions of the patient's history were reviewed and updated as appropriate: allergies, current medications, past family history, past medical history, past social history, past surgical history and problem list. Problem list updated.  Objective:   Vitals:   05/13/20 1047  BP: 113/71  Pulse: 81  Weight: 186 lb 11.2 oz (84.7 kg)    Fetal Status: Fetal Heart Rate (bpm): 146   Movement: Present  Presentation: Vertex  General:  Alert, oriented and cooperative. Patient is in no acute distress.  Skin: Skin is warm and dry. No rash noted.   Cardiovascular: Normal heart rate noted  Respiratory: Normal respiratory effort, no problems with respiration noted  Abdomen: Soft, gravid, appropriate for gestational age. Pain/Pressure: Absent     Pelvic: Vag. Bleeding: None     Cervical exam performed Dilation: Closed Effacement (%): Thick    Extremities: Normal range of motion.  Edema: Trace  Mental Status: Normal mood and affect. Normal behavior. Normal judgment and thought content.   Urinalysis:      Assessment and Plan:  Pregnancy: G3P2002 at [redacted]w[redacted]d  1. Supervision of high risk pregnancy, antepartum BP and FHR normal Membranes unable to be swept, feels vertex IOL scheduled for next week BTL vs Depo for contraception, papers signed 04/13/2020  2. Group B streptococcal carriage complicating pregnancy ppx in labor  Term labor symptoms and general obstetric precautions including but not  limited to vaginal bleeding, contractions, leaking of fluid and fetal movement were reviewed in detail with the patient. Please refer to After Visit Summary for other counseling recommendations.  Return in 6 weeks (on 06/24/2020) for PP check.   Venora Maples, MD

## 2020-05-16 ENCOUNTER — Other Ambulatory Visit (HOSPITAL_COMMUNITY): Payer: Medicaid Other

## 2020-05-17 ENCOUNTER — Other Ambulatory Visit: Payer: Self-pay | Admitting: Advanced Practice Midwife

## 2020-05-18 ENCOUNTER — Encounter (HOSPITAL_COMMUNITY): Payer: Self-pay | Admitting: Family Medicine

## 2020-05-18 ENCOUNTER — Inpatient Hospital Stay (HOSPITAL_COMMUNITY): Payer: Medicaid Other | Admitting: Anesthesiology

## 2020-05-18 ENCOUNTER — Inpatient Hospital Stay (HOSPITAL_COMMUNITY)
Admission: AD | Admit: 2020-05-18 | Discharge: 2020-05-20 | DRG: 807 | Disposition: A | Discharge status: Home / Self Care | Payer: Medicaid Other | Attending: Obstetrics and Gynecology | Admitting: Obstetrics and Gynecology

## 2020-05-18 ENCOUNTER — Other Ambulatory Visit: Payer: Self-pay

## 2020-05-18 ENCOUNTER — Inpatient Hospital Stay (HOSPITAL_COMMUNITY): Payer: Medicaid Other

## 2020-05-18 DIAGNOSIS — O99824 Streptococcus B carrier state complicating childbirth: Secondary | ICD-10-CM | POA: Diagnosis present

## 2020-05-18 DIAGNOSIS — O9962 Diseases of the digestive system complicating childbirth: Secondary | ICD-10-CM | POA: Diagnosis present

## 2020-05-18 DIAGNOSIS — Z20822 Contact with and (suspected) exposure to covid-19: Secondary | ICD-10-CM | POA: Diagnosis present

## 2020-05-18 DIAGNOSIS — Z3A40 40 weeks gestation of pregnancy: Secondary | ICD-10-CM

## 2020-05-18 DIAGNOSIS — K219 Gastro-esophageal reflux disease without esophagitis: Secondary | ICD-10-CM | POA: Diagnosis present

## 2020-05-18 DIAGNOSIS — Z3490 Encounter for supervision of normal pregnancy, unspecified, unspecified trimester: Secondary | ICD-10-CM | POA: Diagnosis present

## 2020-05-18 DIAGNOSIS — O26893 Other specified pregnancy related conditions, third trimester: Secondary | ICD-10-CM | POA: Diagnosis present

## 2020-05-18 DIAGNOSIS — O480 Post-term pregnancy: Secondary | ICD-10-CM | POA: Diagnosis not present

## 2020-05-18 LAB — CBC
HCT: 31.8 % — ABNORMAL LOW (ref 36.0–46.0)
Hemoglobin: 10.7 g/dL — ABNORMAL LOW (ref 12.0–15.0)
MCH: 29 pg (ref 26.0–34.0)
MCHC: 33.6 g/dL (ref 30.0–36.0)
MCV: 86.2 fL (ref 80.0–100.0)
Platelets: 319 10*3/uL (ref 150–400)
RBC: 3.69 MIL/uL — ABNORMAL LOW (ref 3.87–5.11)
RDW: 13.9 % (ref 11.5–15.5)
WBC: 7.3 10*3/uL (ref 4.0–10.5)
nRBC: 0 % (ref 0.0–0.2)

## 2020-05-18 LAB — SARS CORONAVIRUS 2 (TAT 6-24 HRS): SARS Coronavirus 2: NEGATIVE

## 2020-05-18 LAB — TYPE AND SCREEN
ABO/RH(D): O POS
Antibody Screen: NEGATIVE

## 2020-05-18 MED ORDER — LIDOCAINE HCL (PF) 1 % IJ SOLN: 30.0000 mL | INTRAMUSCULAR | Status: DC | PRN

## 2020-05-18 MED ORDER — PHENYLEPHRINE 40 MCG/ML (10ML) SYRINGE FOR IV PUSH (FOR BLOOD PRESSURE SUPPORT): 80.0000 ug | PREFILLED_SYRINGE | INTRAVENOUS | Status: DC | PRN

## 2020-05-18 MED ORDER — SODIUM CHLORIDE 0.9 % IV SOLN
5.0000 10*6.[IU] | Freq: Once | INTRAVENOUS | Status: AC
  Administered 2020-05-18: 5 10*6.[IU] via INTRAVENOUS
  Filled 2020-05-18: qty 5

## 2020-05-18 MED ORDER — EPHEDRINE 5 MG/ML INJ: 10.0000 mg | INTRAVENOUS | Status: DC | PRN

## 2020-05-18 MED ORDER — PENICILLIN G POT IN DEXTROSE 60000 UNIT/ML IV SOLN
3.0000 10*6.[IU] | INTRAVENOUS | Status: DC
  Administered 2020-05-18 (×3): 3 10*6.[IU] via INTRAVENOUS
  Filled 2020-05-18 (×3): qty 50

## 2020-05-18 MED ORDER — ONDANSETRON HCL 4 MG/2ML IJ SOLN: 4.0000 mg | Freq: Four times a day (QID) | INTRAMUSCULAR | Status: DC | PRN

## 2020-05-18 MED ORDER — MISOPROSTOL 50MCG HALF TABLET
ORAL_TABLET | ORAL | Status: AC
  Filled 2020-05-18: qty 1

## 2020-05-18 MED ORDER — TERBUTALINE SULFATE 1 MG/ML IJ SOLN: 0.2500 mg | Freq: Once | INTRAMUSCULAR | Status: DC | PRN

## 2020-05-18 MED ORDER — LACTATED RINGERS IV SOLN: 500.0000 mL | Freq: Once | INTRAVENOUS | Status: DC

## 2020-05-18 MED ORDER — OXYTOCIN-SODIUM CHLORIDE 30-0.9 UT/500ML-% IV SOLN
1.0000 m[IU]/min | INTRAVENOUS | Status: DC
  Administered 2020-05-18: 2 m[IU]/min via INTRAVENOUS
  Administered 2020-05-18: 3 m[IU]/min via INTRAVENOUS
  Administered 2020-05-18: 1 m[IU]/min via INTRAVENOUS
  Administered 2020-05-18: 4 m[IU]/min via INTRAVENOUS

## 2020-05-18 MED ORDER — FENTANYL CITRATE (PF) 100 MCG/2ML IJ SOLN
50.0000 ug | INTRAMUSCULAR | Status: DC | PRN
  Administered 2020-05-18: 100 ug via INTRAVENOUS
  Filled 2020-05-18: qty 2

## 2020-05-18 MED ORDER — OXYTOCIN-SODIUM CHLORIDE 30-0.9 UT/500ML-% IV SOLN: 1.0000 m[IU]/min | INTRAVENOUS | Status: DC

## 2020-05-18 MED ORDER — LACTATED RINGERS IV SOLN: INTRAVENOUS | Status: DC

## 2020-05-18 MED ORDER — DIPHENHYDRAMINE HCL 50 MG/ML IJ SOLN: 12.5000 mg | INTRAMUSCULAR | Status: DC | PRN

## 2020-05-18 MED ORDER — SOD CITRATE-CITRIC ACID 500-334 MG/5ML PO SOLN: 30.0000 mL | ORAL | Status: DC | PRN

## 2020-05-18 MED ORDER — MISOPROSTOL 50MCG HALF TABLET
50.0000 ug | ORAL_TABLET | ORAL | Status: DC
  Administered 2020-05-18: 50 ug via BUCCAL

## 2020-05-18 MED ORDER — OXYTOCIN-SODIUM CHLORIDE 30-0.9 UT/500ML-% IV SOLN
2.5000 [IU]/h | INTRAVENOUS | Status: DC
  Filled 2020-05-18: qty 500

## 2020-05-18 MED ORDER — TERBUTALINE SULFATE 1 MG/ML IJ SOLN
INTRAMUSCULAR | Status: AC
  Filled 2020-05-18: qty 1

## 2020-05-18 MED ORDER — OXYTOCIN BOLUS FROM INFUSION
333.0000 mL | Freq: Once | INTRAVENOUS | Status: AC
  Administered 2020-05-19: 333 mL via INTRAVENOUS

## 2020-05-18 MED ORDER — LACTATED RINGERS IV SOLN
500.0000 mL | INTRAVENOUS | Status: DC | PRN
  Administered 2020-05-18: 500 mL via INTRAVENOUS
  Administered 2020-05-18: 1000 mL via INTRAVENOUS

## 2020-05-18 MED ORDER — ACETAMINOPHEN 325 MG PO TABS
650.0000 mg | ORAL_TABLET | ORAL | Status: DC | PRN
  Administered 2020-05-18: 650 mg via ORAL
  Filled 2020-05-18: qty 2

## 2020-05-18 MED ORDER — FENTANYL-BUPIVACAINE-NACL 0.5-0.125-0.9 MG/250ML-% EP SOLN
12.0000 mL/h | EPIDURAL | Status: DC | PRN
  Filled 2020-05-18: qty 250

## 2020-05-18 NOTE — Progress Notes (Addendum)
LABOR PROGRESS NOTE  Sahory Nordling is a 30 y.o. G3P2002 at [redacted]w[redacted]d by L/17 admitted for elective IOL.  Subjective: Feeling well. Says she may be feeling contractions but unsure. No LOF or bleeding. No headache. Objective: BP 131/74   Pulse 76   Temp 99.1 F (37.3 C) (Oral)   Resp 16   Ht 5\' 2"  (1.575 m)   Wt 86.6 kg   LMP 07/30/2019 (Exact Date)   SpO2 98%   BMI 34.93 kg/m  or  Vitals:   05/18/20 0822 05/18/20 0937 05/18/20 1042  BP: 127/71 122/64 131/74  Pulse: 88 62 76  Resp: 16 16   Temp: 99.1 F (37.3 C)    TempSrc: Oral    SpO2: 98%    Weight: 86.6 kg    Height: 5\' 2"  (1.575 m)      Dilation: 1.5 Effacement (%): 50 Cervical Position: Posterior Station: -3 Presentation: Vertex Exam by:: E Rothermel, RN FHT: baseline rate 135, moderate varibility, 15x15 acel, recurrent variable decels Toco: irregular  Labs: Lab Results  Component Value Date   WBC 7.3 05/18/2020   HGB 10.7 (L) 05/18/2020   HCT 31.8 (L) 05/18/2020   MCV 86.2 05/18/2020   PLT 319 05/18/2020    Patient Active Problem List   Diagnosis Date Noted   Encounter for elective induction of labor 05/18/2020   Chlamydia 03/17/2020   Gastroesophageal reflux in pregnancy 12/22/2019   Group B streptococcal carriage complicating pregnancy 11/30/2019   Supervision of high risk pregnancy, antepartum 11/23/2019    Assessment / Plan: 30 y.o. G3P2002 at [redacted]w[redacted]d here for elective IOL.   Labor: eIOL, s/p cytotec at 1000. Consider foley balloon with pitocin at next check given recurrent variable decels on cytotec. Fetal Wellbeing:  Cat 2 tracing with recurrent variable decels. Reassuringly, improved s/p IVF bolus and maternal repositioning. If persistent or worsening may consider terbutaline. ID: GBS+, PCN started on admission Pain Control:  no medications currently; epidural upon pt request Anticipated MOD:  Vaginal  MOC: undecided, depo with possible interval BTL. Had consent papers signed for BTL  (04/13/20) MOF: breast, plan for laceration consult in postpartum period  [redacted]w[redacted]d, Medical Student 05/18/2020, 11:11 AM  Attestation of Supervision of Student:  I confirm that I have verified the information documented in the medical student's note and that I have also personally reperformed the history, physical exam and all medical decision making activities.  I have verified that all services and findings are accurately documented in this student's note; and I agree with management and plan as outlined in the documentation. I have also made any necessary editorial changes.  Jon Billings, MD Center for [facility], [facility] 05/18/2020 11:35 AM

## 2020-05-18 NOTE — Progress Notes (Addendum)
LABOR PROGRESS NOTE  Joann Jordan is a 30 y.o. G3P2002 at [redacted]w[redacted]d by L/17 admitted for elective IOL.  Subjective: Having mild contractions. No significant pain. No LOF or bleeding.   Objective: BP 102/62   Pulse 66   Temp 98.5 F (36.9 C) (Oral)   Resp 16   Ht 5\' 2"  (1.575 m)   Wt 86.6 kg   LMP 07/30/2019 (Exact Date)   SpO2 98%   BMI 34.93 kg/m  or  Vitals:   05/18/20 1042 05/18/20 1206 05/18/20 1317 05/18/20 1409  BP: 131/74 119/68 (!) 103/57 102/62  Pulse: 76 73 72 66  Resp:  16  16  Temp:  98.5 F (36.9 C)    TempSrc:  Oral    SpO2:      Weight:      Height:        Dilation: 1.5 Effacement (%): 60 Cervical Position: Posterior Station: -1 Presentation: Vertex Exam by:: Abdulla Pooley MD FHT: baseline rate 160, moderate varibility, multiple acels, intermittent variable decels Toco: irregular  Labs: Lab Results  Component Value Date   WBC 7.3 05/18/2020   HGB 10.7 (L) 05/18/2020   HCT 31.8 (L) 05/18/2020   MCV 86.2 05/18/2020   PLT 319 05/18/2020    Patient Active Problem List   Diagnosis Date Noted   Encounter for elective induction of labor 05/18/2020   Chlamydia 03/17/2020   Gastroesophageal reflux in pregnancy 12/22/2019   Group B streptococcal carriage complicating pregnancy 11/30/2019   Supervision of high risk pregnancy, antepartum 11/23/2019    Assessment / Plan: 30 y.o. G3P2002 at [redacted]w[redacted]d here for elective IOL.  Labor: eIOL, s/p cytotec x1; foley balloon at 1515. Will initiate pitocin at 43mL/hr. Fetal Wellbeing:  Cat 2 tracing given intermittent variable decels. If persistent or worsening variable decels may consider terbutaline. ID: GBS+, PCN started on admission Pain Control:  no medications currently; epidural upon pt request Anticipated MOD:  Vaginal  MOC: undecided, depo with possible interval BTL. Had consent papers signed for BTL (04/13/20) MOF: breast, plan for laceration consult in postpartum period  06/13/20, Medical  Student 05/18/2020, 3:21 PM  Attestation of Supervision of Student:  I confirm that I have verified the information documented in the medical student's note and that I have also personally reperformed the history, physical exam and all medical decision making activities.  I have verified that all services and findings are accurately documented in this student's note; and I agree with management and plan as outlined in the documentation. I have also made any necessary editorial changes.  05/20/2020, MD Center for [facility], [facility] 05/18/2020 3:29 PM

## 2020-05-18 NOTE — H&P (Signed)
OBSTETRIC ADMISSION HISTORY AND PHYSICAL  Livana Yerian is a 30 y.o. female G3P2002 with IUP at 63w0dby L/17  presenting for elective IOL. She reports +FMs, No LOF, no VB, no blurry vision, headaches or peripheral edema, and RUQ pain.  She plans on breast feeding. She is undecided for birth control but considering depo to interval BTL.  She [facility] care at [facility] L/17 --->  Estimated Date of Delivery: 05/18/20  Sono:  '@[redacted]w[redacted]d' , CWD, normal anatomy, breech presentation, 695g, 62% EFW  Prenatal History/Complications:  - GERD (PPI in pregnancy) - H/o Chlamydia (s/p negative TOC)  Past Medical History: Past Medical History:  Diagnosis Date  . Anemia     Past Surgical History: Past Surgical History:  Procedure Laterality Date  . NO PAST SURGERIES      Obstetrical History: OB History    Gravida  3   Para  2   Term  2   Preterm      AB      Living  2     SAB      IAB      Ectopic      Multiple      Live Births  2           Social History Social History   Socioeconomic History  . Marital status: Single    Spouse name: Not on file  . Number of children: 2  . Years of education: Not on file  . Highest education level: Not on file  Occupational History  . Not on file  Tobacco Use  . Smoking status: Never Smoker  . Smokeless tobacco: Never Used  Vaping Use  . Vaping Use: Never used  Substance and Sexual Activity  . Alcohol use: Never  . Drug use: Never  . Sexual activity: Not Currently  Other Topics Concern  . Not on file  Social History Narrative  . Not on file   Social Determinants of Health   Financial Resource Strain: Not on file  Food Insecurity: No Food Insecurity  . Worried About RCharity fundraiserin the Last Year: Never true  . Ran Out of Food in the Last Year: Never true  Transportation Needs: No Transportation Needs  . Lack of Transportation (Medical): No  . Lack of Transportation (Non-Medical): No   Physical Activity: Not on file  Stress: Not on file  Social Connections: Not on file    Family History: Family History  Problem Relation Age of Onset  . Diabetes Mother   . Diabetes Father     Allergies: Allergies  Allergen Reactions  . Bee Venom Hives    Medications Prior to Admission  Medication Sig Dispense Refill Last Dose  . Blood Pressure Monitoring (BLOOD PRESSURE KIT) DEVI 1 Device by Does not apply route daily. ICD 10: Z34.00 1 each 0 05/17/2020 at Unknown time  . ondansetron (ZOFRAN-ODT) 4 MG disintegrating tablet Take 1 tablet (4 mg total) by mouth every 8 (eight) hours as needed for nausea or vomiting. 20 tablet 0 Past Month at Unknown time  . pantoprazole (PROTONIX) 40 MG tablet Take 1 tablet (40 mg total) by mouth daily. 30 tablet 5 05/17/2020 at Unknown time  . Prenatal Vit-Fe Fumarate-FA (PRENATAL MULTIVITAMIN) TABS tablet Take 1 tablet by mouth daily at 12 noon.   05/17/2020 at Unknown time  . EPINEPHrine (EPIPEN 2-PAK) 0.3 mg/0.3 mL IJ SOAJ injection Inject 0.3 mLs (0.3 mg total) into the muscle as  needed for anaphylaxis. (Patient not taking: No sig reported) 1 each 0 Unknown at Unknown time     Review of Systems   All systems reviewed and negative except as stated in HPI  Blood pressure 127/71, pulse 88, temperature 99.1 F (37.3 C), temperature source Oral, resp. rate 16, height '5\' 2"'  (1.575 m), weight 86.6 kg, last menstrual period 07/30/2019, SpO2 98 %. General appearance: alert, cooperative and appears stated age Lungs: normal WOB Heart: regular rate  Abdomen: soft, non-tender Extremities:  no sign of DVT Presentation: cephalic Fetal monitoringBaseline: 120 bpm, Variability: Good {> 6 bpm), Accelerations: Reactive and Decelerations: Absent Uterine activityFrequency: Every 7-8 minutes    Prenatal labs: ABO, Rh: O/Positive/-- (10/18 1209) Antibody: Negative (10/18 1209) Rubella: 3.73 (10/18 1209) RPR: Non Reactive (01/13 0924)  HBsAg: Negative  (10/18 1209)  HIV: Non Reactive (01/13 0924)  GBS:    2 hr Glucola wnl Genetic screening wnl Anatomy US wnl  Prenatal Transfer Tool  Maternal Diabetes: No Genetic Screening: Normal Maternal Ultrasounds/Referrals: Normal Fetal Ultrasounds or other Referrals:  None Maternal Substance Abuse:  No Significant Maternal Medications:  None Significant Maternal Lab Results: Group B Strep positive  No results found for this or any previous visit (from the past 24 hour(s)).  Patient Active Problem List   Diagnosis Date Noted  . Encounter for elective induction of labor 05/18/2020  . Chlamydia 03/17/2020  . Gastroesophageal reflux in pregnancy 12/22/2019  . Group B streptococcal carriage complicating pregnancy 60/73/7106  . Supervision of high risk pregnancy, antepartum 11/23/2019    Assessment/Plan:  Jaskirat Zertuche is a 30 y.o. G3P2002 at 46w0dhere for elective IOL.  #Labor: Based on initial cervical exam, cytotec administered on arrival to L&D. #Pain: TBD per pt request #FWB: Category 1 strip #ID: GBS positive >PCN started on admission #MOF: breast #MOC: Plan for depo with possible interval BTL (consent signed 04/13/20) #Circ: n/a  GRanda Ngo MD OB Fellow, Faculty Practice 05/18/2020 9:17 AM

## 2020-05-18 NOTE — Anesthesia Preprocedure Evaluation (Signed)
Anesthesia Evaluation  Patient identified by MRN, date of birth, ID band Patient awake    Reviewed: Allergy & Precautions, Patient's Chart, lab work & pertinent test results  Airway Mallampati: II  TM Distance: >3 FB Neck ROM: Full    Dental no notable dental hx.    Pulmonary neg pulmonary ROS,    Pulmonary exam normal breath sounds clear to auscultation       Cardiovascular negative cardio ROS Normal cardiovascular exam Rhythm:Regular Rate:Normal     Neuro/Psych negative neurological ROS  negative psych ROS   GI/Hepatic Neg liver ROS, GERD  Medicated and Controlled,  Endo/Other  Obesity BMI 35  Renal/GU negative Renal ROS  negative genitourinary   Musculoskeletal negative musculoskeletal ROS (+)   Abdominal (+) + obese,   Peds negative pediatric ROS (+)  Hematology  (+) Blood dyscrasia, anemia , hct 31.8, plt 319   Anesthesia Other Findings   Reproductive/Obstetrics (+) Pregnancy                             Anesthesia Physical Anesthesia Plan  ASA: II and emergent  Anesthesia Plan: Epidural   Post-op Pain Management:    Induction:   PONV Risk Score and Plan: 2  Airway Management Planned: Natural Airway  Additional Equipment: None  Intra-op Plan:   Post-operative Plan:   Informed Consent: I have reviewed the patients History and Physical, chart, labs and discussed the procedure including the risks, benefits and alternatives for the proposed anesthesia with the patient or authorized representative who has indicated his/her understanding and acceptance.       Plan Discussed with:   Anesthesia Plan Comments:         Anesthesia Quick Evaluation

## 2020-05-19 ENCOUNTER — Encounter (HOSPITAL_COMMUNITY): Payer: Self-pay | Admitting: Family Medicine

## 2020-05-19 DIAGNOSIS — Z3A40 40 weeks gestation of pregnancy: Secondary | ICD-10-CM

## 2020-05-19 DIAGNOSIS — O99824 Streptococcus B carrier state complicating childbirth: Secondary | ICD-10-CM

## 2020-05-19 DIAGNOSIS — O480 Post-term pregnancy: Secondary | ICD-10-CM

## 2020-05-19 LAB — RPR: RPR Ser Ql: NONREACTIVE

## 2020-05-19 MED ORDER — SENNOSIDES-DOCUSATE SODIUM 8.6-50 MG PO TABS
2.0000 | ORAL_TABLET | ORAL | Status: DC
  Administered 2020-05-19 – 2020-05-20 (×2): 2 via ORAL
  Filled 2020-05-19 (×2): qty 2

## 2020-05-19 MED ORDER — FENTANYL-BUPIVACAINE-NACL 0.5-0.125-0.9 MG/250ML-% EP SOLN
EPIDURAL | Status: DC | PRN
  Administered 2020-05-19: 12 mL/h via EPIDURAL

## 2020-05-19 MED ORDER — DIBUCAINE (PERIANAL) 1 % EX OINT
1.0000 | TOPICAL_OINTMENT | CUTANEOUS | Status: DC | PRN
Start: 2020-05-19 — End: 2020-05-20

## 2020-05-19 MED ORDER — ONDANSETRON HCL 4 MG/2ML IJ SOLN: 4.0000 mg | INTRAMUSCULAR | Status: DC | PRN

## 2020-05-19 MED ORDER — COCONUT OIL OIL: 1.0000 "application " | TOPICAL_OIL | Status: DC | PRN

## 2020-05-19 MED ORDER — IBUPROFEN 600 MG PO TABS
600.0000 mg | ORAL_TABLET | Freq: Four times a day (QID) | ORAL | Status: DC
  Administered 2020-05-19 – 2020-05-20 (×6): 600 mg via ORAL
  Filled 2020-05-19 (×6): qty 1

## 2020-05-19 MED ORDER — DOCUSATE SODIUM 100 MG PO CAPS
100.0000 mg | ORAL_CAPSULE | Freq: Two times a day (BID) | ORAL | Status: DC
  Administered 2020-05-19 – 2020-05-20 (×2): 100 mg via ORAL
  Filled 2020-05-19 (×2): qty 1

## 2020-05-19 MED ORDER — WITCH HAZEL-GLYCERIN EX PADS: 1.0000 "application " | MEDICATED_PAD | CUTANEOUS | Status: DC | PRN

## 2020-05-19 MED ORDER — LIDOCAINE HCL (PF) 1 % IJ SOLN
INTRAMUSCULAR | Status: DC | PRN
  Administered 2020-05-18: 2 mL via EPIDURAL
  Administered 2020-05-18: 10 mL via EPIDURAL

## 2020-05-19 MED ORDER — SIMETHICONE 80 MG PO CHEW: 80.0000 mg | CHEWABLE_TABLET | ORAL | Status: DC | PRN

## 2020-05-19 MED ORDER — TETANUS-DIPHTH-ACELL PERTUSSIS 5-2.5-18.5 LF-MCG/0.5 IM SUSY: 0.5000 mL | PREFILLED_SYRINGE | Freq: Once | INTRAMUSCULAR | Status: DC

## 2020-05-19 MED ORDER — MEDROXYPROGESTERONE ACETATE 150 MG/ML IM SUSP: 150.0000 mg | INTRAMUSCULAR | Status: DC | PRN

## 2020-05-19 MED ORDER — ACETAMINOPHEN 325 MG PO TABS: 650.0000 mg | ORAL_TABLET | ORAL | Status: DC | PRN

## 2020-05-19 MED ORDER — BENZOCAINE-MENTHOL 20-0.5 % EX AERO: 1.0000 "application " | INHALATION_SPRAY | CUTANEOUS | Status: DC | PRN

## 2020-05-19 MED ORDER — PRENATAL MULTIVITAMIN CH
1.0000 | ORAL_TABLET | Freq: Every day | ORAL | Status: DC
  Administered 2020-05-19 – 2020-05-20 (×2): 1 via ORAL
  Filled 2020-05-19 (×2): qty 1

## 2020-05-19 MED ORDER — ONDANSETRON HCL 4 MG PO TABS: 4.0000 mg | ORAL_TABLET | ORAL | Status: DC | PRN

## 2020-05-19 MED ORDER — DIPHENHYDRAMINE HCL 25 MG PO CAPS: 25.0000 mg | ORAL_CAPSULE | Freq: Four times a day (QID) | ORAL | Status: DC | PRN

## 2020-05-19 NOTE — Progress Notes (Signed)
LABOR PROGRESS NOTE  Tiki Tucciarone is a 30 y.o. G3P2002 at [redacted]w[redacted]d by L/17 admitted for elective IOL.  Subjective: Feeling contraction pain   Objective: BP 120/71   Pulse 64   Temp 98.8 F (37.1 C) (Oral)   Resp 18   Ht 5\' 2"  (1.575 m)   Wt 86.6 kg   LMP 07/30/2019 (Exact Date)   SpO2 98%   BMI 34.93 kg/m  or  Vitals:   05/18/20 2359 05/19/20 0002 05/19/20 0007 05/19/20 0013  BP: 130/88 120/62 105/68 120/71  Pulse: 69 79 64 64  Resp: 18 18 18    Temp:      TempSrc:      SpO2:      Weight:      Height:        Dilation: 10 Dilation Complete Date: 05/19/20 Dilation Complete Time: 0026 Effacement (%): 100 Cervical Position: Posterior Station: 0 Presentation: Vertex Exam by:: Dr. [id number] FHT: baseline rate 125, moderate varibility, posacels, intermittent variable decels Toco: irregular  Labs: Lab Results  Component Value Date   WBC 7.3 05/18/2020   HGB 10.7 (L) 05/18/2020   HCT 31.8 (L) 05/18/2020   MCV 86.2 05/18/2020   PLT 319 05/18/2020    Patient Active Problem List   Diagnosis Date Noted  . Encounter for elective induction of labor 05/18/2020  . Chlamydia 03/17/2020  . Gastroesophageal reflux in pregnancy 12/22/2019  . Group B streptococcal carriage complicating pregnancy 11/30/2019  . Supervision of high risk pregnancy, antepartum 11/23/2019    Assessment / Plan: 30 y.o. G3P2002 at [redacted]w[redacted]d here for elective IOL.  Labor:  Progressing well on pitocin. AROM clear fluid. Pt complete but not feeling urge to push. Will labor down and reevaluate in one hour or sooner.   Fetal Wellbeing:  Cat 2 due to recurrent variables, improved w repositioning  ID: GBS+, PCN started on admission Pain Control:  epidural Anticipated MOD:  Vaginal  MOC: undecided, depo with possible interval BTL. Had consent papers signed for BTL (04/13/20) MOF: breast     [redacted]w[redacted]d, MD [facility] Family Medicine Fellow, [facility] for [facility], [facility]

## 2020-05-19 NOTE — Lactation Note (Signed)
This note was copied from a baby's chart. Lactation Consultation Note Baby just 1 hr old. Mom had on the breast. LC repositioned baby and assisted in obtaining deeper latch. Readjusted mom's pillows and placed pillow for support under her arm and baby. Mom denied painful latch.   Mom has a 29 yr old and 29 yr old. Mom stated her milk didn't come in that well so she had to supplement and did so about 3 months as well as pumping. Then pumped another month and gave BM and formula.  Mom lives here in [place] and [place] lives in [place]. Mom use to have WIC in [place]but isn't sure where she will be living so mom will contact WIC on her own when she figures out what she will be doing. Mom will be f/u on [facility].  Patient Name: Joann Jordan [id number] Date: 05/19/2020 Reason for consult: L&D Initial assessment Age:83 hours  Maternal Data Has patient been taught Hand Expression?: Yes Does the patient have breastfeeding experience prior to this delivery?: Yes How long did the patient breastfeed?: 3 months  Feeding    LATCH Score Latch: Grasps breast easily, tongue down, lips flanged, rhythmical sucking.  Audible Swallowing: None  Type of Nipple: Flat  Comfort (Breast/Nipple): Soft / non-tender  Hold (Positioning): Assistance needed to correctly position infant at breast and maintain latch.  LATCH Score: 6   Lactation Tools Discussed/Used    Interventions Interventions: Breast feeding basics reviewed;Support pillows;Skin to skin;Hand express;Adjust position  Discharge [facility] Program: No  Consult Status Consult Status: Follow-up Date: 05/19/20 Follow-up type: In-patient    Joann Jordan 05/19/2020, 2:45 AM

## 2020-05-19 NOTE — Anesthesia Procedure Notes (Signed)
Epidural Patient location during procedure: OB Start time: 05/18/2020 11:52 PM End time: 05/19/2020 12:00 AM  Staffing Anesthesiologist: Lannie Fields, DO Performed: anesthesiologist   Preanesthetic Checklist Completed: patient identified, IV checked, risks and benefits discussed, monitors and equipment checked, pre-op evaluation and timeout performed  Epidural Patient position: sitting Prep: DuraPrep and site prepped and draped Patient monitoring: continuous pulse ox, blood pressure, heart rate and cardiac monitor Approach: midline Location: L3-L4 Injection technique: LOR air  Needle:  Needle type: Tuohy  Needle gauge: 17 G Needle length: 9 cm Needle insertion depth: 6 cm Catheter type: closed end flexible Catheter size: 19 Gauge Catheter at skin depth: 11 cm Test dose: negative  Assessment Sensory level: T8 Events: blood not aspirated, injection not painful, no injection resistance, no paresthesia and negative IV test  Additional Notes Patient identified. Risks/Benefits/Options discussed with patient including but not limited to bleeding, infection, nerve damage, paralysis, failed block, incomplete pain control, headache, blood pressure changes, nausea, vomiting, reactions to medication both or allergic, itching and postpartum back pain. Confirmed with bedside nurse the patient's most recent platelet count. Confirmed with patient that they are not currently taking any anticoagulation, have any bleeding history or any family history of bleeding disorders. Patient expressed understanding and wished to proceed. All questions were answered. Sterile technique was used throughout the entire procedure. Please see nursing notes for vital signs. Test dose was given through epidural catheter and negative prior to continuing to dose epidural or start infusion. Warning signs of high block given to the patient including shortness of breath, tingling/numbness in hands, complete motor  block, or any concerning symptoms with instructions to call for help. Patient was given instructions on fall risk and not to get out of bed. All questions and concerns addressed with instructions to call with any issues or inadequate analgesia.  Reason for block:procedure for pain

## 2020-05-19 NOTE — Anesthesia Postprocedure Evaluation (Signed)
Anesthesia Post Note  Patient: Agricultural engineer  Procedure(s) Performed: AN AD HOC LABOR EPIDURAL     Patient location during evaluation: Mother Baby Anesthesia Type: Epidural Level of consciousness: awake and alert Pain management: pain level controlled Vital Signs Assessment: post-procedure vital signs reviewed and stable Respiratory status: spontaneous breathing, nonlabored ventilation and respiratory function stable Cardiovascular status: stable Postop Assessment: no headache, no backache and epidural receding Anesthetic complications: no   No complications documented.  Last Vitals:  Vitals:   05/19/20 0330 05/19/20 0431  BP: 117/87 111/75  Pulse: 70 71  Resp: 17 17  Temp: 37.2 C 36.9 C  SpO2: 99% 100%    Last Pain:  Vitals:   05/19/20 0431  TempSrc: Oral  PainSc:    Pain Goal: Patients Stated Pain Goal: 3 (05/18/20 2246)                 Fanny Dance

## 2020-05-19 NOTE — Lactation Note (Signed)
This note was copied from a baby's chart. Lactation Consultation Note  Patient Name: Joann Jordan XIPJA'S Date: 05/19/2020   Age:29 hours  Infant cluster feeding.  Mom really wants breastfeeding to work.  Urged to feed on cue and at least 8-12 or more times day. Not to give pacifier. Discussed adding some hand expression and spoon feeding to breastfeeding. Urged to call lactation as needed  Maternal Data    Feeding    LATCH Score                    Lactation Tools Discussed/Used    Interventions    Discharge    Consult Status      Neomia Dear 05/19/2020, 11:25 PM

## 2020-05-19 NOTE — Discharge Summary (Signed)
Postpartum Discharge Summary     Patient Name: Joann Jordan DOB: 09-07-1990 MRN: [id number]  Date of admission: 05/18/2020 Delivery date:05/19/2020  Delivering provider: Janet Berlin  Date of discharge: 05/20/2020  Admitting diagnosis: Encounter for elective induction of labor [Z34.90] Intrauterine pregnancy: [redacted]w[redacted]d    Secondary diagnosis:  Active Problems:   Encounter for elective induction of labor   Vaginal delivery  Additional problems: none    Discharge diagnosis: Term Pregnancy Delivered                                              Post partum procedures:depo Augmentation: AROM, Pitocin, Cytotec and IP Foley Complications: None  Hospital course: Induction of Labor With Vaginal Delivery   30y.o. yo G3P2002 at 415w1das admitted to the hospital 05/18/2020 for induction of labor.  Indication for induction: Elective.  Patient had an uncomplicated labor course as follows: Membrane Rupture Time/Date: 12:27 AM ,05/19/2020   Delivery Method:Vaginal, Spontaneous  Episiotomy: None  Lacerations:  None  Details of delivery can be found in separate delivery note.  Patient had a routine postpartum course. Patient is discharged home 05/20/20.  Newborn Data: Birth date:05/19/2020  Birth time:1:29 AM  Gender:Female  Living status:Living  Apgars:9 ,9  [phone]   Magnesium Sulfate received: No BMZ received: No Rhophylac:N/A MMR:N/A T-DaP:Given prenatally Flu: Yes Transfusion:No  Physical exam  Vitals:   05/19/20 1213 05/19/20 1705 05/19/20 2052 05/20/20 0508  BP: 106/65 107/67 122/84 120/74  Pulse: (!) 107 74 78 74  Resp: _0 Temp: 98.6 F (37 C) 98.4 F (36.9 C) 98.5 F (36.9 C) 98.3 F (36.8 C)  TempSrc: Oral Oral Oral Oral  SpO2: 99%  98% 99%  Weight:      Height:       General: alert, cooperative and no distress Lochia: appropriate Uterine Fundus: firm Incision: N/A DVT Evaluation: No evidence of DVT seen on physical exam. Labs: Lab  Results  Component Value Date   WBC 7.3 05/18/2020   HGB 10.7 (L) 05/18/2020   HCT 31.8 (L) 05/18/2020   MCV 86.2 05/18/2020   PLT 319 05/18/2020   CMP Latest Ref Rng & Units 08/20/2019  Glucose 70 - 99 mg/dL 100(H)  BUN 6 - 20 mg/dL 10  Creatinine 0.44 - 1.00 mg/dL 0.76  Sodium 135 - 145 mmol/L 136  Potassium 3.5 - 5.1 mmol/L 4.2  Chloride 98 - 111 mmol/L 99  CO2 22 - 32 mmol/L 26  Calcium 8.9 - 10.3 mg/dL 9.2  Total Protein 6.5 - 8.1 g/dL 8.4(H)  Total Bilirubin 0.3 - 1.2 mg/dL 0.5  Alkaline Phos 38 - 126 U/L 51  AST 15 - 41 U/L 26  ALT 0 - 44 U/L 15   Edinburgh Score: Edinburgh Postnatal Depression Scale Screening Tool 05/19/2020  I have been able to laugh and see the funny side of things. 0  I have looked forward with enjoyment to things. 0  I have blamed myself unnecessarily when things went wrong. 0  I have been anxious or worried for no good reason. 0  I have felt scared or panicky for no good reason. 0  Things have been getting on top of me. 0  I have been so unhappy that I have had difficulty sleeping. 0  I have felt sad or miserable. 0  I  have been so unhappy that I have been crying. 0  The thought of harming myself has occurred to me. 0  Edinburgh Postnatal Depression Scale Total 0     After visit meds:  Allergies as of 05/20/2020      Reactions   Bee Venom Hives      Medication List    TAKE these medications   acetaminophen 325 MG tablet Commonly known as: Tylenol Take 2 tablets (650 mg total) by mouth every 4 (four) hours as needed (for pain scale < 4).   Blood Pressure Kit Devi 1 Device by Does not apply route daily. ICD 10: Z34.00   coconut oil Oil Apply 1 application topically as needed.   docusate sodium 100 MG capsule Commonly known as: COLACE Take 1 capsule (100 mg total) by mouth 2 (two) times daily.   EPINEPHrine 0.3 mg/0.3 mL Soaj injection Commonly known as: EpiPen 2-Pak Inject 0.3 mLs (0.3 mg total) into the muscle as needed for  anaphylaxis.   ibuprofen 600 MG tablet Commonly known as: ADVIL Take 1 tablet (600 mg total) by mouth every 6 (six) hours.   ondansetron 4 MG disintegrating tablet Commonly known as: ZOFRAN-ODT Take 1 tablet (4 mg total) by mouth every 8 (eight) hours as needed for nausea or vomiting.   pantoprazole 40 MG tablet Commonly known as: Protonix Take 1 tablet (40 mg total) by mouth daily.   prenatal multivitamin Tabs tablet Take 1 tablet by mouth daily at 12 noon.        Discharge home in stable condition Infant Feeding: Breast Infant Disposition:home with mother Discharge instruction: per After Visit Summary and Postpartum booklet. Activity: Advance as tolerated. Pelvic rest for 6 weeks.  Diet: routine diet Future Appointments: Future Appointments  Date Time Provider [place]  06/24/2020 10:15 AM Danielle Rankin [facility]   Follow up Visit:   Please schedule this patient for a In Person postpartum visit in 6 weeks with the following provider: Any provider. Additional Postpartum F/U:none  Low risk pregnancy complicated by: none Delivery mode:  Vaginal, Spontaneous vaginal Anticipated Birth Control:  Depo, desires OP IUD at [facility] visit    05/20/2020 Janet Berlin, MD

## 2020-05-19 NOTE — Plan of Care (Signed)
  Problem: Education: Goal: Knowledge of condition will improve Outcome: Progressing   Problem: Coping: Goal: Ability to identify and utilize available resources and services will improve Outcome: Progressing   Problem: Activity: Goal: Will verbalize the importance of balancing activity with adequate rest periods Outcome: Progressing Goal: Ability to tolerate increased activity will improve Outcome: Progressing   Problem: Life Cycle: Goal: Chance of risk for complications during the postpartum period will decrease Outcome: Progressing

## 2020-05-20 MED ORDER — COCONUT OIL OIL: 1.0000 "application " | TOPICAL_OIL | 0 refills | Status: AC | PRN

## 2020-05-20 MED ORDER — DOCUSATE SODIUM 100 MG PO CAPS
100.0000 mg | ORAL_CAPSULE | Freq: Two times a day (BID) | ORAL | 0 refills | Status: AC
Start: 2020-05-20 — End: ?

## 2020-05-20 MED ORDER — IBUPROFEN 600 MG PO TABS: 600.0000 mg | ORAL_TABLET | Freq: Four times a day (QID) | ORAL | 0 refills | Status: AC

## 2020-05-20 MED ORDER — ACETAMINOPHEN 325 MG PO TABS: 650.0000 mg | ORAL_TABLET | ORAL | 0 refills | Status: AC | PRN

## 2020-05-20 NOTE — Lactation Note (Signed)
This note was copied from a baby's chart. Lactation Consultation Note  Patient Name: Joann Jordan [id number] Date: 05/20/2020 Reason for consult: Follow-up assessment Age:29 hours   P3, Baby cueing.  Mother has had some issues in the past with pain and breastfeeding. She would like to breastfeed longer than 3 mos. With this child. Mother latched baby with ease.  Noted swallows. Reviewed engorgement care and monitoring voids/stools. Feed on demand with cues.  Goal 8-12+ times per day after first 24 hrs.  Place baby STS if not cueing.    Feeding Mother's Current Feeding Choice: Breast Milk  LATCH Score Latch: Grasps breast easily, tongue down, lips flanged, rhythmical sucking.  Audible Swallowing: A few with stimulation  Type of Nipple: Everted at rest and after stimulation  Comfort (Breast/Nipple): Filling, red/small blisters or bruises, mild/mod discomfort  Hold (Positioning): No assistance needed to correctly position infant at breast.  LATCH Score: 8  Interventions Interventions: Breast feeding basics reviewed;Education  Discharge  Engorgement care reviewed.   Consult Status Consult Status: Complete Date: 05/20/20    [name] [facility] 05/20/2020, 9:02 AM

## 2020-05-20 NOTE — Discharge Instructions (Signed)

## 2020-06-24 ENCOUNTER — Ambulatory Visit: Payer: Medicaid Other | Admitting: Medical

## 2020-11-12 ENCOUNTER — Encounter: Payer: Self-pay | Admitting: Radiology
